# Patient Record
Sex: Female | Born: 1985 | Hispanic: Yes | Marital: Single | Smoking: Former smoker
Health system: Southern US, Community
[De-identification: ages and names within clinical notes are randomized; demographics above are authoritative.]

## PROBLEM LIST (undated history)

## (undated) DIAGNOSIS — F53 Postpartum depression: Secondary | ICD-10-CM

## (undated) DIAGNOSIS — O99345 Other mental disorders complicating the puerperium: Secondary | ICD-10-CM

## (undated) DIAGNOSIS — E785 Hyperlipidemia, unspecified: Secondary | ICD-10-CM

## (undated) DIAGNOSIS — E119 Type 2 diabetes mellitus without complications: Secondary | ICD-10-CM

## (undated) DIAGNOSIS — O24419 Gestational diabetes mellitus in pregnancy, unspecified control: Secondary | ICD-10-CM

## (undated) DIAGNOSIS — R011 Cardiac murmur, unspecified: Secondary | ICD-10-CM

---

## 2011-09-14 DIAGNOSIS — F53 Postpartum depression: Secondary | ICD-10-CM

## 2011-09-14 DIAGNOSIS — O24419 Gestational diabetes mellitus in pregnancy, unspecified control: Secondary | ICD-10-CM

## 2011-09-14 HISTORY — DX: Gestational diabetes mellitus in pregnancy, unspecified control: O24.419

## 2011-09-14 HISTORY — DX: Postpartum depression: F53.0

## 2012-07-06 DIAGNOSIS — O24419 Gestational diabetes mellitus in pregnancy, unspecified control: Secondary | ICD-10-CM

## 2013-11-11 HISTORY — PX: LAPAROSCOPIC CHOLECYSTECTOMY: SUR755

## 2014-08-15 ENCOUNTER — Ambulatory Visit (INDEPENDENT_AMBULATORY_CARE_PROVIDER_SITE_OTHER): Payer: Self-pay | Admitting: Family Medicine

## 2014-08-15 VITALS — BP 120/72 | HR 88 | Temp 98.6°F | Resp 16 | Ht 68.5 in | Wt 202.4 lb

## 2014-08-15 DIAGNOSIS — A09 Infectious gastroenteritis and colitis, unspecified: Secondary | ICD-10-CM

## 2014-08-15 DIAGNOSIS — K529 Noninfective gastroenteritis and colitis, unspecified: Secondary | ICD-10-CM

## 2014-08-15 MED ORDER — CIPROFLOXACIN HCL 250 MG PO TABS
500.0000 mg | ORAL_TABLET | Freq: Once | ORAL | Status: AC
  Administered 2014-08-15: 500 mg via ORAL

## 2014-08-15 MED ORDER — CIPROFLOXACIN HCL 500 MG PO TABS: 500.0000 mg | ORAL_TABLET | Freq: Two times a day (BID) | ORAL | Status: DC

## 2014-08-15 MED ORDER — PROMETHAZINE HCL 25 MG PO TABS: 25.0000 mg | ORAL_TABLET | Freq: Three times a day (TID) | ORAL | Status: DC | PRN

## 2014-08-15 MED ORDER — ONDANSETRON 4 MG PO TBDP
8.0000 mg | ORAL_TABLET | Freq: Once | ORAL | Status: AC
  Administered 2014-08-15: 8 mg via ORAL

## 2014-08-15 NOTE — Patient Instructions (Signed)
Dehydration, Adult °Dehydration is when you lose more fluids from the body than you take in. Vital organs like the kidneys, brain, and heart cannot function without a proper amount of fluids and salt. Any loss of fluids from the body can cause dehydration.  °CAUSES  °· Vomiting. °· Diarrhea. °· Excessive sweating. °· Excessive urine output. °· Fever. °SYMPTOMS  °Mild dehydration °· Thirst. °· Dry lips. °· Slightly dry mouth. °Moderate dehydration °· Very dry mouth. °· Sunken eyes. °· Skin does not bounce back quickly when lightly pinched and released. °· Dark urine and decreased urine production. °· Decreased tear production. °· Headache. °Severe dehydration °· Very dry mouth. °· Extreme thirst. °· Rapid, weak pulse (more than 100 beats per minute at rest). °· Cold hands and feet. °· Not able to sweat in spite of heat and temperature. °· Rapid breathing. °· Blue lips. °· Confusion and lethargy. °· Difficulty being awakened. °· Minimal urine production. °· No tears. °DIAGNOSIS  °Your caregiver will diagnose dehydration based on your symptoms and your exam. Blood and urine tests will help confirm the diagnosis. The diagnostic evaluation should also identify the cause of dehydration. °TREATMENT  °Treatment of mild or moderate dehydration can often be done at home by increasing the amount of fluids that you drink. It is best to drink small amounts of fluid more often. Drinking too much at one time can make vomiting worse. Refer to the home care instructions below. °Severe dehydration needs to be treated at the hospital where you will probably be given intravenous (IV) fluids that contain water and electrolytes. °HOME CARE INSTRUCTIONS  °· Ask your caregiver about specific rehydration instructions. °· Drink enough fluids to keep your urine clear or pale yellow. °· Drink small amounts frequently if you have nausea and vomiting. °· Eat as you normally do. °· Avoid: °¨ Foods or drinks high in sugar. °¨ Carbonated  drinks. °¨ Juice. °¨ Extremely hot or cold fluids. °¨ Drinks with caffeine. °¨ Fatty, greasy foods. °¨ Alcohol. °¨ Tobacco. °¨ Overeating. °¨ Gelatin desserts. °· Wash your hands well to avoid spreading bacteria and viruses. °· Only take over-the-counter or prescription medicines for pain, discomfort, or fever as directed by your caregiver. °· Ask your caregiver if you should continue all prescribed and over-the-counter medicines. °· Keep all follow-up appointments with your caregiver. °SEEK MEDICAL CARE IF: °· You have abdominal pain and it increases or stays in one area (localizes). °· You have a rash, stiff neck, or severe headache. °· You are irritable, sleepy, or difficult to awaken. °· You are weak, dizzy, or extremely thirsty. °SEEK IMMEDIATE MEDICAL CARE IF:  °· You are unable to keep fluids down or you get worse despite treatment. °· You have frequent episodes of vomiting or diarrhea. °· You have blood or green matter (bile) in your vomit. °· You have blood in your stool or your stool looks black and tarry. °· You have not urinated in 6 to 8 hours, or you have only urinated a small amount of very dark urine. °· You have a fever. °· You faint. °MAKE SURE YOU:  °· Understand these instructions. °· Will watch your condition. °· Will get help right away if you are not doing well or get worse. °Document Released: 08/30/2005 Document Revised: 11/22/2011 Document Reviewed: 04/19/2011 °ExitCare® Patient Information ©2015 [employer]. This information is not intended to replace advice given to you by your health care provider. Make sure you discuss any questions you have with your health care   provider. ° °Food Choices to Help Relieve Diarrhea °When you have diarrhea, the foods you eat and your eating habits are very important. Choosing the right foods and drinks can help relieve diarrhea. Also, because diarrhea can last up to 7 days, you need to replace lost fluids and electrolytes (such as sodium, potassium, and  chloride) in order to help prevent dehydration.  °WHAT GENERAL GUIDELINES DO I NEED TO FOLLOW? °· Slowly drink 1 cup (8 oz) of fluid for each episode of diarrhea. If you are getting enough fluid, your urine will be clear or pale yellow. °· Eat starchy foods. Some good choices include white rice, white toast, pasta, low-fiber cereal, baked potatoes (without the skin), saltine crackers, and bagels. °· Avoid large servings of any cooked vegetables. °· Limit fruit to two servings per day. A serving is ½ cup or 1 small piece. °· Choose foods with less than 2 g of fiber per serving. °· Limit fats to less than 8 tsp (38 g) per day. °· Avoid fried foods. °· Eat foods that have probiotics in them. Probiotics can be found in certain dairy products. °· Avoid foods and beverages that may increase the speed at which food moves through the stomach and intestines (gastrointestinal tract). Things to avoid include: °· High-fiber foods, such as dried fruit, raw fruits and vegetables, nuts, seeds, and whole grain foods. °· Spicy foods and high-fat foods. °· Foods and beverages sweetened with high-fructose corn syrup, honey, or sugar alcohols such as xylitol, sorbitol, and mannitol. °WHAT FOODS ARE RECOMMENDED? °Grains °White rice. White, French, or pita breads (fresh or toasted), including plain rolls, buns, or bagels. White pasta. Saltine, soda, or graham crackers. Pretzels. Low-fiber cereal. Cooked cereals made with water (such as cornmeal, farina, or cream cereals). Plain muffins. Matzo. Melba toast. Zwieback.  °Vegetables °Potatoes (without the skin). Strained tomato and vegetable juices. Most well-cooked and canned vegetables without seeds. Tender lettuce. °Fruits °Cooked or canned applesauce, apricots, cherries, fruit cocktail, grapefruit, peaches, pears, or plums. Fresh bananas, apples without skin, cherries, grapes, cantaloupe, grapefruit, peaches, oranges, or plums.  °Meat and Other Protein Products °Baked or boiled chicken.  Eggs. Tofu. Fish. Seafood. Smooth peanut butter. Ground or well-cooked tender beef, ham, veal, lamb, pork, or poultry.  °Dairy °Plain yogurt, kefir, and unsweetened liquid yogurt. Lactose-free milk, buttermilk, or soy milk. Plain hard cheese. °Beverages °Sport drinks. Clear broths. Diluted fruit juices (except prune). Regular, caffeine-free sodas such as ginger ale. Water. Decaffeinated teas. Oral rehydration solutions. Sugar-free beverages not sweetened with sugar alcohols. °Other °Bouillon, broth, or soups made from recommended foods.  °The items listed above may not be a complete list of recommended foods or beverages. Contact your dietitian for more options. °WHAT FOODS ARE NOT RECOMMENDED? °Grains °Whole grain, whole wheat, bran, or rye breads, rolls, pastas, crackers, and cereals. Wild or brown rice. Cereals that contain more than 2 g of fiber per serving. Corn tortillas or taco shells. Cooked or dry oatmeal. Granola. Popcorn. °Vegetables °Raw vegetables. Cabbage, broccoli, Brussels sprouts, artichokes, baked beans, beet greens, corn, kale, legumes, peas, sweet potatoes, and yams. Potato skins. Cooked spinach and cabbage. °Fruits °Dried fruit, including raisins and dates. Raw fruits. Stewed or dried prunes. Fresh apples with skin, apricots, mangoes, pears, raspberries, and strawberries.  °Meat and Other Protein Products °Chunky peanut butter. Nuts and seeds. Beans and lentils. Bacon.  °Dairy °High-fat cheeses. Milk, chocolate milk, and beverages made with milk, such as milk shakes. Cream. Ice cream. °Sweets and Desserts °Sweet rolls, doughnuts, and   sweet breads. Pancakes and waffles. °Fats and Oils °Butter. Cream sauces. Margarine. Salad oils. Plain salad dressings. Olives. Avocados.  °Beverages °Caffeinated beverages (such as coffee, tea, soda, or energy drinks). Alcoholic beverages. Fruit juices with pulp. Prune juice. Soft drinks sweetened with high-fructose corn syrup or sugar alcohols. °Other °Coconut.  Hot sauce. Chili powder. Mayonnaise. Gravy. Cream-based or milk-based soups.  °The items listed above may not be a complete list of foods and beverages to avoid. Contact your dietitian for more information. °WHAT SHOULD I DO IF I BECOME DEHYDRATED? °Diarrhea can sometimes lead to dehydration. Signs of dehydration include dark urine and dry mouth and skin. If you think you are dehydrated, you should rehydrate with an oral rehydration solution. These solutions can be purchased at pharmacies, retail stores, or online.  °Drink ½-1 cup (120-240 mL) of oral rehydration solution each time you have an episode of diarrhea. If drinking this amount makes your diarrhea worse, try drinking smaller amounts more often. For example, drink 1-3 tsp (5-15 mL) every 5-10 minutes.  °A general rule for staying hydrated is to drink 1½-2 L of fluid per day. Talk to your health care provider about the specific amount you should be drinking each day. Drink enough fluids to keep your urine clear or pale yellow. °Document Released: 11/20/2003 Document Revised: 09/04/2013 Document Reviewed: 07/23/2013 °ExitCare® Patient Information ©2015 [employer]. This information is not intended to replace advice given to you by your health care provider. Make sure you discuss any questions you have with your health care provider. ° °Viral Gastroenteritis °Viral gastroenteritis is also known as stomach flu. This condition affects the stomach and intestinal tract. It can cause sudden diarrhea and vomiting. The illness typically lasts 3 to 8 days. Most people develop an immune response that eventually gets rid of the virus. While this natural response develops, the virus can make you quite ill. °CAUSES  °Many different viruses can cause gastroenteritis, such as rotavirus or noroviruses. You can catch one of these viruses by consuming contaminated food or water. You may also catch a virus by sharing utensils or other personal items with an infected person or  by touching a contaminated surface. °SYMPTOMS  °The most common symptoms are diarrhea and vomiting. These problems can cause a severe loss of body fluids (dehydration) and a body salt (electrolyte) imbalance. Other symptoms may include: °· Fever. °· Headache. °· Fatigue. °· Abdominal pain. °DIAGNOSIS  °Your caregiver can usually diagnose viral gastroenteritis based on your symptoms and a physical exam. A stool sample may also be taken to test for the presence of viruses or other infections. °TREATMENT  °This illness typically goes away on its own. Treatments are aimed at rehydration. The most serious cases of viral gastroenteritis involve vomiting so severely that you are not able to keep fluids down. In these cases, fluids must be given through an intravenous line (IV). °HOME CARE INSTRUCTIONS  °· Drink enough fluids to keep your urine clear or pale yellow. Drink small amounts of fluids frequently and increase the amounts as tolerated. °· Ask your caregiver for specific rehydration instructions. °· Avoid: °¨ Foods high in sugar. °¨ Alcohol. °¨ Carbonated drinks. °¨ Tobacco. °¨ Juice. °¨ Caffeine drinks. °¨ Extremely hot or cold fluids. °¨ Fatty, greasy foods. °¨ Too much intake of anything at one time. °¨ Dairy products until 24 to 48 hours after diarrhea stops. °· You may consume probiotics. Probiotics are active cultures of beneficial bacteria. They may lessen the amount and number of diarrheal   stools in adults. Probiotics can be found in yogurt with active cultures and in supplements. °· Wash your hands well to avoid spreading the virus. °· Only take over-the-counter or prescription medicines for pain, discomfort, or fever as directed by your caregiver. Do not give aspirin to children. Antidiarrheal medicines are not recommended. °· Ask your caregiver if you should continue to take your regular prescribed and over-the-counter medicines. °· Keep all follow-up appointments as directed by your caregiver. °SEEK  IMMEDIATE MEDICAL CARE IF:  °· You are unable to keep fluids down. °· You do not urinate at least once every 6 to 8 hours. °· You develop shortness of breath. °· You notice blood in your stool or vomit. This may look like coffee grounds. °· You have abdominal pain that increases or is concentrated in one small area (localized). °· You have persistent vomiting or diarrhea. °· You have a fever. °· The patient is a child younger than 3 months, and he or she has a fever. °· The patient is a child older than 3 months, and he or she has a fever and persistent symptoms. °· The patient is a child older than 3 months, and he or she has a fever and symptoms suddenly get worse. °· The patient is a baby, and he or she has no tears when crying. °MAKE SURE YOU:  °· Understand these instructions. °· Will watch your condition. °· Will get help right away if you are not doing well or get worse. °Document Released: 08/30/2005 Document Revised: 11/22/2011 Document Reviewed: 06/16/2011 °ExitCare® Patient Information ©2015 [employer]. This information is not intended to replace advice given to you by your health care provider. Make sure you discuss any questions you have with your health care provider. ° °

## 2014-09-22 NOTE — Progress Notes (Signed)
Subjective:    Patient ID: Alison Rowe, female    DOB: 19-Aug-1986, 29 y.o.   MRN: [id number] This chart was scribed for Norberto SorensonEva Shaw, MD by Jolene Provostobert Halas, Medical Scribe. This patient was seen in Room 4 and the patient's care was started a 3:32 PM.  Chief Complaint  Patient presents with  . Diarrhea    6 days  . Emesis  . Abdominal Pain    upper adominal pain with some cramping  . Fatigue    Diarrhea  Associated symptoms include abdominal pain, chills, headaches (resolving) and vomiting. Pertinent negatives include no fever.  Emesis  Associated symptoms include abdominal pain, chills, diarrhea, dizziness and headaches (resolving). Pertinent negatives include no fever.  Abdominal Pain Associated symptoms include diarrhea, headaches (resolving), nausea and vomiting. Pertinent negatives include no fever.   Past Medical History  Diagnosis Date  . Depression    Allergies not on file No current outpatient prescriptions on file prior to visit.   No current facility-administered medications on file prior to visit.    HPI Comments: Alison Rowe is a 29 y.o. female who [facility] complaining of nausea and vomiting that started six days ago. Pt states that she was driving [place] the country and stopped at a road side food establishment and that night at the hotel began experiencing significant symptoms of nausea, vomiting, diarrhea and HA. Pt endorses associated chills, weakness, a large volume of diarrhea, and epigastric abdominal pain. Pt denies CP, palpitations, heart racing, fever, hematemesis, hematochezia, or coffee grounds in vomit or diarrhea. Pt states she is able to drink water and gatorade regularly. Pt states she is urinating regularly without pain. Pt states her urine has been a normal color today. Pt is unable to eat anything without an emetic response. Pt took imodium for the first two days without relief of her diarrhea symptoms. Pt has not taken any abx recently.      Review of Systems  Constitutional: Positive for chills. Negative for fever.  Gastrointestinal: Positive for nausea, vomiting, abdominal pain and diarrhea. Negative for blood in stool and abdominal distention.  Genitourinary: Negative for vaginal bleeding.       Irregular period.  Neurological: Positive for dizziness, weakness, light-headedness and headaches (resolving).       Objective:  BP 120/72 mmHg  Pulse 88  Temp(Src) 98.6 F (37 C) (Oral)  Resp 16  Ht 5' 8.5" (1.74 m)  Wt 202 lb 6.4 oz (91.808 kg)  BMI 30.32 kg/m2  SpO2 98%  LMP 08/01/2014 (Approximate)  Physical Exam  Constitutional: She is oriented to person, place, and time. She appears well-developed and well-nourished.  HENT:  Head: Normocephalic and atraumatic.  Eyes: Pupils are equal, round, and reactive to light.  Neck: Neck supple.  Cardiovascular: Normal rate, regular rhythm and normal heart sounds.   No murmur heard. Pulmonary/Chest: Effort normal and breath sounds normal. No respiratory distress. She has no wheezes.  Abdominal: Soft. Bowel sounds are decreased. There is tenderness. There is no rebound, no guarding and no CVA tenderness.  Generalized tenderness. Negaitve murphy's sign. No significant tenderness at mcburnys point.   Neurological: She is alert and oriented to person, place, and time.  Skin: Skin is warm and dry.  Psychiatric: She has a normal mood and affect. Her behavior is normal.  Nursing note and vitals reviewed.      Assessment & Plan:   Gastroenteritis, infectious, presumed - Plan: ondansetron (ZOFRAN-ODT) disintegrating tablet 8 mg, ciprofloxacin (CIPRO) tablet 500 mg  PUsh fluids  Meds ordered this encounter  Medications  . promethazine (PHENERGAN) 25 MG tablet    Sig: Take 1 tablet (25 mg total) by mouth every 8 (eight) hours as needed for nausea or vomiting.    Dispense:  30 tablet    Refill:  0  . ciprofloxacin (CIPRO) 500 MG tablet    Sig: Take 1 tablet (500 mg  total) by mouth 2 (two) times daily.    Dispense:  14 tablet    Refill:  0  . ondansetron (ZOFRAN-ODT) disintegrating tablet 8 mg    Sig:   . ciprofloxacin (CIPRO) tablet 500 mg    Sig:     I personally performed the services described in this documentation, which was scribed in my presence. The recorded information has been reviewed and considered, and addended by me as needed.  Norberto Sorenson, MD MPH

## 2015-06-09 ENCOUNTER — Encounter (HOSPITAL_COMMUNITY): Payer: Self-pay | Admitting: *Deleted

## 2015-06-09 ENCOUNTER — Emergency Department (HOSPITAL_COMMUNITY)
Admission: EM | Admit: 2015-06-09 | Discharge: 2015-06-09 | Disposition: A | Discharge status: Home / Self Care | Payer: Medicaid Other | Source: Home / Self Care | Attending: Family Medicine | Admitting: Family Medicine

## 2015-06-09 DIAGNOSIS — N63 Unspecified lump in breast: Secondary | ICD-10-CM | POA: Diagnosis not present

## 2015-06-09 DIAGNOSIS — N6310 Unspecified lump in the right breast, unspecified quadrant: Secondary | ICD-10-CM

## 2015-06-09 LAB — HM MAMMOGRAPHY

## 2015-06-09 NOTE — ED Provider Notes (Addendum)
CSN: [id number]     Arrival date & time 06/09/15  1513 History   First MD Initiated Contact with Patient 06/09/15 1648     Chief Complaint  Patient presents with  . Breast Problem   (Consider location/radiation/quality/duration/timing/severity/associated sxs/prior Treatment) Patient is a 29 y.o. female presenting with female genitourinary complaint. The history is provided by the patient.  Female GU Problem This is a new problem. The current episode started more than 1 week ago. The problem has not changed since onset.Associated symptoms include chest pain. Associated symptoms comments: Had menses last week with resolution of left sided soreness however mass noted on right, no prior h/o breast problems, has an iud for bc, no fever..    Past Medical History  Diagnosis Date  . Depression    Past Surgical History  Procedure Laterality Date  . Cesarean section    . Gallbladder surgery     Family History  Problem Relation Age of Onset  . Diabetes Mother   . Diabetes Father   . Hyperlipidemia Father   . Hypertension Father   . Diabetes Maternal Grandmother    Social History  Substance Use Topics  . Smoking status: Never Smoker   . Smokeless tobacco: None  . Alcohol Use: No   OB History    No data available     Review of Systems  Constitutional: Negative.   HENT: Negative.   Cardiovascular: Positive for chest pain.  All other systems reviewed and are negative.   Allergies  Review of patient's allergies indicates no known allergies.  Home Medications   Prior to Admission medications   Medication Sig Start Date End Date Taking? Authorizing Provider  ciprofloxacin (CIPRO) 500 MG tablet Take 1 tablet (500 mg total) by mouth 2 (two) times daily. 08/15/14   Sherren Mocha, MD  promethazine (PHENERGAN) 25 MG tablet Take 1 tablet (25 mg total) by mouth every 8 (eight) hours as needed for nausea or vomiting. 08/15/14   Sherren Mocha, MD   Meds Ordered and Administered this Visit    Medications - No data to display  BP 115/81 mmHg  Pulse 101  Temp(Src) 99.3 F (37.4 C) (Oral)  Resp 16  SpO2 98%  LMP 06/03/2015 No data found.   Physical Exam  Constitutional: She appears well-developed and well-nourished.  Neck: Normal range of motion. Neck supple.  Cardiovascular: Normal rate, normal heart sounds and intact distal pulses.   Pulmonary/Chest: Effort normal and breath sounds normal. She exhibits tenderness.    Lymphadenopathy:    She has no cervical adenopathy.  Nursing note and vitals reviewed.   ED Course  Procedures (including critical care time)  Labs Review Labs Reviewed - No data to display  Imaging Review No results found.   Visual Acuity Review  Right Eye Distance:   Left Eye Distance:   Bilateral Distance:    Right Eye Near:   Left Eye Near:    Bilateral Near:         MDM   1. Breast mass, right        Linna Hoff, MD 06/09/15 1308  Linna Hoff, MD 06/09/15 2126

## 2015-06-09 NOTE — Discharge Instructions (Signed)
Heat and advil for soreness, get mammogram as possible for further eval of lump.

## 2015-06-09 NOTE — ED Notes (Signed)
Pt    Reports      She noticed  A  Lump  In  Her  r  Breast  X   1  Week  She reports  It is  Tender  To  The  Touch

## 2015-06-11 ENCOUNTER — Emergency Department (HOSPITAL_COMMUNITY): Admission: EM | Admit: 2015-06-11 | Discharge: 2015-06-11 | Discharge status: Absent without leave | Payer: Self-pay

## 2015-06-12 ENCOUNTER — Telehealth: Payer: Self-pay | Admitting: Family Medicine

## 2015-06-12 DIAGNOSIS — N61 Mastitis without abscess: Secondary | ICD-10-CM

## 2015-06-12 MED ORDER — AMOXICILLIN-POT CLAVULANATE 875-125 MG PO TABS: 1.0000 | ORAL_TABLET | Freq: Two times a day (BID) | ORAL | Status: DC

## 2015-06-12 NOTE — Telephone Encounter (Signed)
Dr. Tilda Burrow from [facility] called and spoke with Dr. Milus Glazier regarding mastitis. Per Dr. Milus Glazier send in Augmentin  1 po bid #[phone]. Copy this to Dr. Artis Flock and Dr. Milus Glazier will call this afternoon and speak with Dr. Artis Flock

## 2015-07-02 ENCOUNTER — Emergency Department (HOSPITAL_COMMUNITY)
Admission: EM | Admit: 2015-07-02 | Discharge: 2015-07-02 | Disposition: A | Discharge status: Home / Self Care | Payer: Medicaid Other | Attending: Emergency Medicine | Admitting: Emergency Medicine

## 2015-07-02 ENCOUNTER — Encounter (HOSPITAL_COMMUNITY): Payer: Self-pay | Admitting: *Deleted

## 2015-07-02 DIAGNOSIS — Z8632 Personal history of gestational diabetes: Secondary | ICD-10-CM | POA: Diagnosis not present

## 2015-07-02 DIAGNOSIS — Z792 Long term (current) use of antibiotics: Secondary | ICD-10-CM | POA: Diagnosis not present

## 2015-07-02 DIAGNOSIS — Z8659 Personal history of other mental and behavioral disorders: Secondary | ICD-10-CM | POA: Insufficient documentation

## 2015-07-02 DIAGNOSIS — N63 Unspecified lump in breast: Secondary | ICD-10-CM | POA: Insufficient documentation

## 2015-07-02 DIAGNOSIS — N6310 Unspecified lump in the right breast, unspecified quadrant: Secondary | ICD-10-CM

## 2015-07-02 HISTORY — DX: Gestational diabetes mellitus in pregnancy, unspecified control: O24.419

## 2015-07-02 MED ORDER — SULFAMETHOXAZOLE-TRIMETHOPRIM 800-160 MG PO TABS: 1.0000 | ORAL_TABLET | Freq: Two times a day (BID) | ORAL | Status: AC

## 2015-07-02 MED ORDER — HYDROCODONE-ACETAMINOPHEN 5-325 MG PO TABS: 2.0000 | ORAL_TABLET | ORAL | Status: DC | PRN

## 2015-07-02 MED ORDER — FLUORESCEIN SODIUM 1 MG OP STRP: 1.0000 | ORAL_STRIP | Freq: Once | OPHTHALMIC | Status: DC

## 2015-07-02 NOTE — Discharge Instructions (Signed)

## 2015-07-02 NOTE — ED Notes (Signed)
Pt reports rt medial breast mass and pain - pt was seen previously at an [facility] and dx w/ mastitis and given antibiotics, pt has completed the course of antibiotics and continues to experience pain and swelling. Pt states her PCP advised it may be a cyst - pt concerned bc the mass is now the biggest it ever has been. Pt in no acute distress, not currently breast feeding. Pt denies fever.

## 2015-07-02 NOTE — ED Provider Notes (Signed)
CSN: [id number]     Arrival date & time 07/02/15  2056 History  By signing my name below, I, Lyndel Safe, attest that this documentation has been prepared under the direction and in the presence of Ok Edwards, [place]. Electronically Signed: Lyndel Safe, ED Scribe. 07/02/2015. 10:16 PM.   Chief Complaint  Patient presents with  . Breast Mass   The history is provided by the patient. No language interpreter was used.   HPI Comments: Corey Caulfield is a 29 y.o. female who presents to the Emergency Department complaining of a worsening, hard area of pain and swelling to right, medial breasts that has been present for 4 weeks but recently worsened today with worsening pain and new onset erythema. The pt has been evaluated for the same complaint at an [facility] over 3 weeks ago. An [place] was obtained at [facility] after [facility] visit and she was diagnosed with mastitis and prescribed Augmentin which she has completed without relief of symptoms. Pt was then evaluated by her PCP who believed the area to by cystic but did not prescribe any medications or treatments and referred her to an OB GYN which she has not been able to follow up with. She is not currently breast feeding. No fevers.    Past Medical History  Diagnosis Date  . Depression   . Gestational diabetes    Past Surgical History  Procedure Laterality Date  . Cesarean section    . Gallbladder surgery     Family History  Problem Relation Age of Onset  . Diabetes Mother   . Diabetes Father   . Hyperlipidemia Father   . Hypertension Father   . Diabetes Maternal Grandmother    Social History  Substance Use Topics  . Smoking status: Never Smoker   . Smokeless tobacco: None  . Alcohol Use: 0.0 oz/week    0 Standard drinks or equivalent per week     Comment: occasionally   OB History    No data available     Review of Systems  Constitutional: Negative for fever.  Skin: Positive for color change ( erythema ).  All other  systems reviewed and are negative.  Allergies  Review of patient's allergies indicates no known allergies.  Home Medications   Prior to Admission medications   Medication Sig Start Date End Date Taking? Authorizing Provider  amoxicillin-clavulanate (AUGMENTIN) 875-125 MG tablet Take 1 tablet by mouth 2 (two) times daily. 06/12/15   Elvina Sidle, MD  ciprofloxacin (CIPRO) 500 MG tablet Take 1 tablet (500 mg total) by mouth 2 (two) times daily. 08/15/14   Sherren Mocha, MD  promethazine (PHENERGAN) 25 MG tablet Take 1 tablet (25 mg total) by mouth every 8 (eight) hours as needed for nausea or vomiting. 08/15/14   Sherren Mocha, MD   BP 125/78 mmHg  Pulse 111  Temp(Src) 98.6 F (37 C) (Oral)  Resp 20  Ht  (1.727 m)  Wt 203 lb 5 oz (92.222 kg)  BMI 30.92 kg/m2  SpO2 96%  LMP 05/19/2015 (Within Days) Physical Exam  Constitutional: She is oriented to person, place, and time. She appears well-developed and well-nourished. No distress.  HENT:  Head: Normocephalic.  Eyes: Conjunctivae are normal.  Neck: Normal range of motion. Neck supple.  Cardiovascular: Normal rate.   Pulmonary/Chest: Effort normal. No respiratory distress.  Musculoskeletal: Normal range of motion.  Neurological: She is alert and oriented to person, place, and time. Coordination normal.  Skin: Skin is warm. There  is erythema.  Baseball sized area of swelling and erythema to right breast.   Psychiatric: She has a normal mood and affect. Her behavior is normal.  Nursing note and vitals reviewed.   ED Course  Procedures  DIAGNOSTIC STUDIES: Oxygen Saturation is 96% on RA, adequate by my interpretation.    COORDINATION OF CARE: 10:13 PM Discussed treatment plan with pt at bedside and pt agreed to plan. Will prescribe antibiotic course and refer pt to a surgical service.    MDM   Final diagnoses:  Breast mass, right     Bactrim Hydrocodone Pt advised to schedule the Surgeon   I personally performed the  services in this documentation, which was scribed in my presence.  The recorded information has been reviewed and considered.   Barnet PallKaren SofiaPAC.   Lonia SkinnerLeslie K PonchatoulaSofia, PA-C 07/02/15 2239  Gwyneth SproutWhitney Plunkett, MD 07/05/15 [phone]

## 2015-07-02 NOTE — ED Notes (Signed)
Patient is alert and orientedx4.  Patient was explained discharge instructions and they understood them with no questions.   

## 2015-07-04 ENCOUNTER — Encounter: Payer: Self-pay | Admitting: Family Medicine

## 2015-07-07 LAB — HM MAMMOGRAPHY

## 2015-07-17 ENCOUNTER — Encounter: Payer: Self-pay | Admitting: Family Medicine

## 2015-07-21 ENCOUNTER — Encounter: Payer: Self-pay | Admitting: Family Medicine

## 2015-07-24 ENCOUNTER — Encounter: Payer: Self-pay | Admitting: Family Medicine

## 2015-08-11 ENCOUNTER — Encounter: Payer: Self-pay | Admitting: Family Medicine

## 2015-10-25 ENCOUNTER — Emergency Department (HOSPITAL_COMMUNITY): Payer: Medicaid Other

## 2015-10-25 ENCOUNTER — Encounter (HOSPITAL_COMMUNITY): Payer: Self-pay | Admitting: *Deleted

## 2015-10-25 ENCOUNTER — Inpatient Hospital Stay (HOSPITAL_COMMUNITY)
Admission: EM | Admit: 2015-10-25 | Discharge: 2015-10-27 | DRG: 601 | Disposition: A | Discharge status: Home / Self Care | Payer: Medicaid Other | Attending: Internal Medicine | Admitting: Internal Medicine

## 2015-10-25 DIAGNOSIS — N611 Abscess of the breast and nipple: Secondary | ICD-10-CM | POA: Diagnosis present

## 2015-10-25 DIAGNOSIS — Z8632 Personal history of gestational diabetes: Secondary | ICD-10-CM | POA: Diagnosis not present

## 2015-10-25 DIAGNOSIS — E669 Obesity, unspecified: Secondary | ICD-10-CM | POA: Diagnosis present

## 2015-10-25 DIAGNOSIS — E119 Type 2 diabetes mellitus without complications: Secondary | ICD-10-CM | POA: Diagnosis present

## 2015-10-25 DIAGNOSIS — Z6830 Body mass index (BMI) 30.0-30.9, adult: Secondary | ICD-10-CM

## 2015-10-25 DIAGNOSIS — Z833 Family history of diabetes mellitus: Secondary | ICD-10-CM | POA: Diagnosis not present

## 2015-10-25 DIAGNOSIS — E1165 Type 2 diabetes mellitus with hyperglycemia: Secondary | ICD-10-CM | POA: Diagnosis not present

## 2015-10-25 DIAGNOSIS — Z23 Encounter for immunization: Secondary | ICD-10-CM | POA: Diagnosis not present

## 2015-10-25 LAB — URINE MICROSCOPIC-ADD ON

## 2015-10-25 LAB — CBC WITH DIFFERENTIAL/PLATELET
BASOS ABS: 0 10*3/uL (ref 0.0–0.1)
BASOS PCT: 0 %
EOS ABS: 0 10*3/uL (ref 0.0–0.7)
Eosinophils Relative: 0 %
HEMATOCRIT: 44.3 % (ref 36.0–46.0)
HEMOGLOBIN: 15 g/dL (ref 12.0–15.0)
Lymphocytes Relative: 21 %
Lymphs Abs: 2.2 10*3/uL (ref 0.7–4.0)
MCH: 30.7 pg (ref 26.0–34.0)
MCHC: 33.9 g/dL (ref 30.0–36.0)
MCV: 90.6 fL (ref 78.0–100.0)
Monocytes Absolute: 0.3 10*3/uL (ref 0.1–1.0)
Monocytes Relative: 3 %
NEUTROS ABS: 8.1 10*3/uL — AB (ref 1.7–7.7)
NEUTROS PCT: 76 %
Platelets: 320 10*3/uL (ref 150–400)
RBC: 4.89 MIL/uL (ref 3.87–5.11)
RDW: 12.4 % (ref 11.5–15.5)
WBC: 10.7 10*3/uL — ABNORMAL HIGH (ref 4.0–10.5)

## 2015-10-25 LAB — URINALYSIS, ROUTINE W REFLEX MICROSCOPIC
BILIRUBIN URINE: NEGATIVE
Glucose, UA: >1000 mg/dL — AB
Hgb urine dipstick: NEGATIVE
KETONES UR: 15 mg/dL — AB
LEUKOCYTES UA: NEGATIVE
NITRITE: NEGATIVE
PROTEIN: NEGATIVE mg/dL
Specific Gravity, Urine: 1.033 — ABNORMAL HIGH (ref 1.005–1.030)
pH: 5 (ref 5.0–8.0)

## 2015-10-25 LAB — COMPREHENSIVE METABOLIC PANEL
ALK PHOS: 78 U/L (ref 38–126)
ALT: 25 U/L (ref 14–54)
ANION GAP: 12 (ref 5–15)
AST: 22 U/L (ref 15–41)
Albumin: 3.9 g/dL (ref 3.5–5.0)
BILIRUBIN TOTAL: 0.7 mg/dL (ref 0.3–1.2)
BUN: 5 mg/dL — ABNORMAL LOW (ref 6–20)
CALCIUM: 9.3 mg/dL (ref 8.9–10.3)
CO2: 25 mmol/L (ref 22–32)
CREATININE: 0.57 mg/dL (ref 0.44–1.00)
Chloride: 96 mmol/L — ABNORMAL LOW (ref 101–111)
Glucose, Bld: 250 mg/dL — ABNORMAL HIGH (ref 65–99)
Potassium: 4 mmol/L (ref 3.5–5.1)
SODIUM: 133 mmol/L — AB (ref 135–145)
Total Protein: 8.2 g/dL — ABNORMAL HIGH (ref 6.5–8.1)

## 2015-10-25 LAB — I-STAT CG4 LACTIC ACID, ED
LACTIC ACID, VENOUS: 1.19 mmol/L (ref 0.5–2.0)
Lactic Acid, Venous: 1.18 mmol/L (ref 0.5–2.0)

## 2015-10-25 LAB — POC URINE PREG, ED: PREG TEST UR: NEGATIVE

## 2015-10-25 MED ORDER — ONDANSETRON HCL 4 MG/2ML IJ SOLN: 4.0000 mg | Freq: Four times a day (QID) | INTRAMUSCULAR | Status: DC | PRN

## 2015-10-25 MED ORDER — SODIUM CHLORIDE 0.9 % IV SOLN
1000.0000 mL | INTRAVENOUS | Status: DC
  Administered 2015-10-25 – 2015-10-27 (×4): 1000 mL via INTRAVENOUS

## 2015-10-25 MED ORDER — HEPARIN SODIUM (PORCINE) 5000 UNIT/ML IJ SOLN
5000.0000 [IU] | Freq: Three times a day (TID) | INTRAMUSCULAR | Status: DC
  Administered 2015-10-26 – 2015-10-27 (×4): 5000 [IU] via SUBCUTANEOUS
  Filled 2015-10-25 (×4): qty 1

## 2015-10-25 MED ORDER — KETOROLAC TROMETHAMINE 30 MG/ML IJ SOLN
30.0000 mg | Freq: Four times a day (QID) | INTRAMUSCULAR | Status: DC | PRN
  Administered 2015-10-25 – 2015-10-27 (×4): 30 mg via INTRAVENOUS
  Filled 2015-10-25 (×4): qty 1

## 2015-10-25 MED ORDER — ONDANSETRON HCL 4 MG/2ML IJ SOLN
4.0000 mg | Freq: Once | INTRAMUSCULAR | Status: AC
  Administered 2015-10-25: 4 mg via INTRAVENOUS
  Filled 2015-10-25: qty 2

## 2015-10-25 MED ORDER — HYDROCODONE-ACETAMINOPHEN 5-325 MG PO TABS
1.0000 | ORAL_TABLET | Freq: Four times a day (QID) | ORAL | Status: DC | PRN
  Administered 2015-10-26 – 2015-10-27 (×3): 2 via ORAL
  Administered 2015-10-27: 1 via ORAL
  Filled 2015-10-25 (×4): qty 2

## 2015-10-25 MED ORDER — ONDANSETRON 4 MG PO TBDP: 4.0000 mg | ORAL_TABLET | Freq: Three times a day (TID) | ORAL | Status: DC | PRN

## 2015-10-25 MED ORDER — HYDROMORPHONE HCL 1 MG/ML IJ SOLN
1.0000 mg | Freq: Once | INTRAMUSCULAR | Status: AC
  Administered 2015-10-25: 1 mg via INTRAVENOUS
  Filled 2015-10-25: qty 1

## 2015-10-25 MED ORDER — SODIUM CHLORIDE 0.9 % IV SOLN
1000.0000 mL | Freq: Once | INTRAVENOUS | Status: AC
  Administered 2015-10-25: 1000 mL via INTRAVENOUS

## 2015-10-25 MED ORDER — CLINDAMYCIN PHOSPHATE 900 MG/50ML IV SOLN
900.0000 mg | Freq: Once | INTRAVENOUS | Status: AC
  Administered 2015-10-25: 900 mg via INTRAVENOUS
  Filled 2015-10-25: qty 50

## 2015-10-25 MED ORDER — INSULIN ASPART 100 UNIT/ML ~~LOC~~ SOLN
0.0000 [IU] | SUBCUTANEOUS | Status: DC
  Administered 2015-10-26: 3 [IU] via SUBCUTANEOUS
  Administered 2015-10-26: 2 [IU] via SUBCUTANEOUS
  Administered 2015-10-26: 5 [IU] via SUBCUTANEOUS
  Administered 2015-10-26: 3 [IU] via SUBCUTANEOUS
  Administered 2015-10-26: 2 [IU] via SUBCUTANEOUS
  Administered 2015-10-26: 5 [IU] via SUBCUTANEOUS
  Administered 2015-10-27 (×2): 3 [IU] via SUBCUTANEOUS
  Administered 2015-10-27: 2 [IU] via SUBCUTANEOUS

## 2015-10-25 NOTE — ED Notes (Addendum)
Attempted report 

## 2015-10-25 NOTE — ED Notes (Signed)
Patient called for triage. Unable to locate at this time.

## 2015-10-25 NOTE — ED Provider Notes (Signed)
CSN: [id number]     Arrival date & time 10/25/15  1504 History   First MD Initiated Contact with Patient 10/25/15 1738     Chief Complaint  Patient presents with  . Breast Problem   Patient is a 30 y.o. female presenting with abscess. The history is provided by the patient and the spouse. No language interpreter was used.  Abscess Abscess location: R breast. Abscess quality: fluctuance, painful, redness and warmth   Red streaking: no   Duration:  4 weeks Progression:  Worsening Pain details:    Quality:  Aching   Severity:  Moderate   Duration:  1 week   Timing:  Constant   Progression:  Worsening Chronicity:  Recurrent Context: diabetes   Relieved by:  None tried Worsened by:  Nothing tried Associated symptoms: no fever, no nausea and no vomiting   Risk factors: prior abscess     Past Medical History  Diagnosis Date  . Depression   . Gestational diabetes    Past Surgical History  Procedure Laterality Date  . Cesarean section    . Gallbladder surgery     Family History  Problem Relation Age of Onset  . Diabetes Mother   . Diabetes Father   . Hyperlipidemia Father   . Hypertension Father   . Diabetes Maternal Grandmother    Social History  Substance Use Topics  . Smoking status: Never Smoker   . Smokeless tobacco: None  . Alcohol Use: 0.0 oz/week    0 Standard drinks or equivalent per week     Comment: occasionally   OB History    No data available     Review of Systems  Constitutional: Positive for chills. Negative for fever.  HENT: Negative for congestion and dental problem.   Eyes: Negative for discharge and itching.  Respiratory: Negative for chest tightness and shortness of breath.   Cardiovascular: Negative for chest pain.  Gastrointestinal: Negative for nausea and vomiting.  Genitourinary: Negative for dysuria and difficulty urinating.  Skin: Positive for rash.    Allergies  Review of patient's allergies indicates no known allergies.  Home  Medications   Prior to Admission medications   Medication Sig Start Date End Date Taking? Authorizing Provider  acetaminophen-codeine (TYLENOL #3) 300-30 MG tablet Take by mouth every 4 (four) hours as needed for moderate pain.   Yes Historical Provider, MD  doxycycline (VIBRA-TABS) 100 MG tablet Take 1 tablet by mouth 2 (two) times daily. 10/20/15  Yes Historical Provider, MD  fluconazole (DIFLUCAN) 150 MG tablet Take 1 tablet by mouth as needed. Yeast infection 10/20/15  Yes Historical Provider, MD  HYDROcodone-acetaminophen (NORCO) 10-325 MG tablet Take 1 tablet by mouth every 6 (six) hours as needed.   Yes Historical Provider, MD  ibuprofen (ADVIL,MOTRIN) 600 MG tablet Take by mouth every 6 (six) hours as needed.   Yes Historical Provider, MD  levonorgestrel (MIRENA) 20 MCG/24HR IUD 1 each by Intrauterine route once.   Yes Historical Provider, MD  acetaminophen (TYLENOL) 500 MG tablet Take 500 mg by mouth every 6 (six) hours as needed.    Historical Provider, MD  HYDROcodone-acetaminophen (NORCO/VICODIN) 5-325 MG tablet Take 2 tablets by mouth every 4 (four) hours as needed. 07/02/15   Elson Areas, PA-C  naproxen (NAPROSYN) 500 MG tablet Take 500 mg by mouth daily as needed.    Historical Provider, MD  VITAMIN E PO Take by mouth.    Historical Provider, MD   BP 98/62 mmHg  Pulse 103  Temp(Src) 98.4 F (36.9 C)  Resp 16  Ht  (1.727 m)  Wt 89.869 kg  BMI 30.13 kg/m2  SpO2 96%  LMP 10/25/2015 Physical Exam  Constitutional: She is oriented to person, place, and time. She appears well-developed and well-nourished.  HENT:  Head: Normocephalic and atraumatic.  Eyes: Pupils are equal, round, and reactive to light.  Neck: Normal range of motion.  Cardiovascular: Normal rate and regular rhythm.   Pulmonary/Chest: No respiratory distress.  Abdominal: She exhibits no distension. There is no tenderness. There is no rebound and no guarding.  Musculoskeletal: Normal range of motion.    Neurological: She is alert and oriented to person, place, and time.  Skin: Rash noted.     Psychiatric: She has a normal mood and affect. Her behavior is normal.  Nursing note and vitals reviewed.   ED Course  Procedures (including critical care time) Labs Review Labs Reviewed  COMPREHENSIVE METABOLIC PANEL - Abnormal; Notable for the following:    Sodium 133 (*)    Chloride 96 (*)    Glucose, Bld 250 (*)    BUN 5 (*)    Total Protein 8.2 (*)    All other components within normal limits  URINALYSIS, ROUTINE W REFLEX MICROSCOPIC (NOT AT [facility]) - Abnormal; Notable for the following:    Specific Gravity, Urine 1.033 (*)    Glucose, UA >1000 (*)    Ketones, ur 15 (*)    All other components within normal limits  CBC WITH DIFFERENTIAL/PLATELET - Abnormal; Notable for the following:    WBC 10.7 (*)    Neutro Abs 8.1 (*)    All other components within normal limits  URINE MICROSCOPIC-ADD ON - Abnormal; Notable for the following:    Squamous Epithelial / LPF 0-5 (*)    Bacteria, UA RARE (*)    All other components within normal limits  HEMOGLOBIN A1C  I-STAT CG4 LACTIC ACID, ED  POC URINE PREG, ED  I-STAT CG4 LACTIC ACID, ED    Imaging Review US Breast Ltd Uni Right Inc Axilla  10/25/2015  CLINICAL DATA:  30 year old female left inflammatory changes and swelling of the right breast concerning for abscess. EXAM: ULTRASOUND OF THE right BREAST COMPARISON:  Previous exam(s). FINDINGS: On physical exam, right breast tenderness and inflammation noted Targeted sonographic images of the right breast in the region of the clinical concern in the upper inner quadrant demonstrates lobular appearing complex fluid collection with low-level internal echogenicity which may represent hematoma or abscess. No flow identified within these collections. The largest collection measures approximately 10 x 4 cm and extends from the retroareolar region into the upper inner quadrant. These collection is  approximately 5 mm from the skin surface. IMPRESSION: Multilobulated complex fluid collection in the right breast may represent hematoma or abscesses. Clinical correlation is recommended. Electronically Signed   By: Elgie Collard M.D.   On: 10/25/2015 20:19   I have personally reviewed and evaluated these images and lab results as part of my medical decision-making.   EKG Interpretation None      MDM   Final diagnoses:  Abscess of right breast    Patient with likely new onset type 2 diabetes presents today for worsening of right breast abscess. She has been treated outpatient with doxycycline since last Monday  (6 days ago). She endorses chills and worsening pain with prompt her visit today.  Patient afebrile with heart rate of 109 in triage. She has erythematous, fluctuant, tender right breast abscess. She  appears systemically well. She has a lactate of 1.19., CMP with blood sugar of 250. White blood cell count 10.7. UA negative.  I have concern for right recurrent right breast abscess which has failed outpatient treatment.  I will start clindamycin in the emergency department, 1 L normal saline bolus, Dilaudid, Zofran and obtain a right breast ultrasound to completely characterize abscess versus mass.  8:51 PM: Discussed patient's course and expected need for treatment with internal medicine teaching service who will come see patient in the ED.  Discussed care patient with my attending Dr. Jeraldine Loots.  Dan Humphreys, MD 10/25/15 1610  Gerhard Munch, MD 10/25/15 [phone]

## 2015-10-25 NOTE — H&P (Signed)
Date: 10/25/2015               Patient Name:  Alison Rowe MRN: [id number]  DOB: Sep 04, 1986 Age / Sex: 30 y.o., female   PCP: [facility]         Medical Service: Internal Medicine Teaching Service         Attending Physician: Dr. Gerhard Munch, MD    First Contact: Dr. Gwynn Burly Pager: [phone]  Second Contact: Dr. Heywood Iles Pager: [phone]       After Hours (After 5p/  First Contact Pager: [phone]  weekends / holidays): Second Contact Pager: [phone]   Chief Complaint: Worsening right breast pain  History of Present Illness: Alison Rowe is a 29yo with PMH of gestational DM and previous right breast abscess who presents with worsening right breast pain for the past 4 weeks. She says 4 weeks ago, she started having sharp and burning pain deep to her right nipple as well as underlying 'lumps'. Over the past few weeks, she has noticed that the pain has gotten worse, the lumps have increased in size, and the overlying breast skin has become warm and red. She went to see her new PCP 6 days ago, who started her on doxycycline. However, her symptoms have continued to get worse since then, and she started feeling subjective 'hot flashes' and chills since yesterday which prompted her to come in. She had these identical symptoms in the same area of her breast back in 05/2015 months ago, which she said was relieved with drainage. She is not breast feeding, denies any trauma to the breast. She denies other symptoms, including nipple discharge, headache, myalgias, chest pain, SOB, cough, N/V/D, abdominal pain, or rashes.  She is a G1P1, last pregnancy ~3 1/2 years ago complicated by GDM t/w insulin, resolved before end of pregnancy, birth via C-section. LMP today. Has IUD in place for [facility]. H/o yeast infections, but no h/o STDs. Non-smoker, denies EtOH or rec drug use.    Meds: Current Facility-Administered Medications  Medication Dose Route Frequency Provider Last Rate  Last Dose  . 0.9 %  sodium chloride infusion  1,000 mL Intravenous Continuous Dan Humphreys, MD 125 mL/hr at 10/25/15 1856 1,000 mL at 10/25/15 1856  . ketorolac (TORADOL) 30 MG/ML injection 30 mg  30 mg Intravenous Q6H PRN Lora Paula, MD       Current Outpatient Prescriptions  Medication Sig Dispense Refill  . acetaminophen-codeine (TYLENOL #3) 300-30 MG tablet Take by mouth every 4 (four) hours as needed for moderate pain.    Marland Kitchen doxycycline (VIBRA-TABS) 100 MG tablet Take 1 tablet by mouth 2 (two) times daily.  0  . fluconazole (DIFLUCAN) 150 MG tablet Take 1 tablet by mouth as needed. Yeast infection  0  . HYDROcodone-acetaminophen (NORCO) 10-325 MG tablet Take 1 tablet by mouth every 6 (six) hours as needed.    Marland Kitchen ibuprofen (ADVIL,MOTRIN) 600 MG tablet Take by mouth every 6 (six) hours as needed.    Marland Kitchen levonorgestrel (MIRENA) 20 MCG/24HR IUD 1 each by Intrauterine route once.    Marland Kitchen acetaminophen (TYLENOL) 500 MG tablet Take 500 mg by mouth every 6 (six) hours as needed.    Marland Kitchen HYDROcodone-acetaminophen (NORCO/VICODIN) 5-325 MG tablet Take 2 tablets by mouth every 4 (four) hours as needed. 16 tablet 0  . naproxen (NAPROSYN) 500 MG tablet Take 500 mg by mouth daily as needed.    Marland Kitchen VITAMIN E PO Take by mouth.  Allergies: Allergies as of 10/25/2015  . (No Known Allergies)   Past Medical History  Diagnosis Date  . Depression   . Gestational diabetes    Past Surgical History  Procedure Laterality Date  . Cesarean section    . Gallbladder surgery     Family History  Problem Relation Age of Onset  . Diabetes Mother   . Diabetes Father   . Hyperlipidemia Father   . Hypertension Father   . Diabetes Maternal Grandmother    Social History   Social History  . Marital Status: Single    Spouse Name: N/A  . Number of Children: N/A  . Years of Education: N/A   Occupational History  . Not on file.   Social History Main Topics  . Smoking status: Never Smoker   . Smokeless  tobacco: Not on file  . Alcohol Use: 0.0 oz/week    0 Standard drinks or equivalent per week     Comment: occasionally  . Drug Use: No  . Sexual Activity: Yes    Birth Control/ Protection: IUD   Other Topics Concern  . Not on file   Social History Narrative    Review of Systems: Pertinent items noted in HPI and remainder of comprehensive ROS otherwise negative.  Physical Exam: Blood pressure 100/65, pulse 96, temperature 98.4 F (36.9 C), resp. rate 16, height 5\' 8"  (1.727 m), weight 198 lb 2 oz (89.869 kg), last menstrual period 10/25/2015, SpO2 95 %. Gen: Well-appearing, alert and oriented to person, place, and time HEENT: Oropharynx clear without erythema or exudate.  Neck: No cervical LAD, no thyromegaly or nodules, no JVD noted. CV: Normal rate, regular rhythm, no murmurs, rubs, or gallops Pulmonary: Normal effort, CTA bilaterally, no crackles or wheezes Abdominal: Soft, non-tender, non-distended, without rebound, guarding, or masses Extremities: Distal pulses 2+ in upper and lower extremities bilaterally, no tenderness, erythema or edema Skin: Right breast with golf ball-sized indurated swelling, exquisitely tender to palpation with overlying edema and erythema. No nipple discharge expressible. Left breast normal in appearance.  Lab results: Basic Metabolic Panel:  Recent Labs  16/10/96 1556  NA 133*  K 4.0  CL 96*  CO2 25  GLUCOSE 250*  BUN 5*  CREATININE 0.57  CALCIUM 9.3   Liver Function Tests:  Recent Labs  10/25/15 1556  AST 22  ALT 25  ALKPHOS 78  BILITOT 0.7  PROT 8.2*  ALBUMIN 3.9   CBC:  Recent Labs  10/25/15 1556  WBC 10.7*  NEUTROABS 8.1*  HGB 15.0  HCT 44.3  MCV 90.6  PLT 320   Urinalysis:  Recent Labs  10/25/15 1548  COLORURINE YELLOW  LABSPEC 1.033*  PHURINE 5.0  GLUCOSEU >1000*  HGBUR NEGATIVE  BILIRUBINUR NEGATIVE  KETONESUR 15*  PROTEINUR NEGATIVE  NITRITE NEGATIVE  LEUKOCYTESUR NEGATIVE   Imaging results:    US Breast Ltd Uni Right Inc Axilla  10/25/2015  CLINICAL DATA:  30 year old female left inflammatory changes and swelling of the right breast concerning for abscess. EXAM: ULTRASOUND OF THE right BREAST COMPARISON:  Previous exam(s). FINDINGS: On physical exam, right breast tenderness and inflammation noted Targeted sonographic images of the right breast in the region of the clinical concern in the upper inner quadrant demonstrates lobular appearing complex fluid collection with low-level internal echogenicity which may represent hematoma or abscess. No flow identified within these collections. The largest collection measures approximately 10 x 4 cm and extends from the retroareolar region into the upper inner quadrant. These collection is approximately  5 mm from the skin surface. IMPRESSION: Multilobulated complex fluid collection in the right breast may represent hematoma or abscesses. Clinical correlation is recommended. Electronically Signed   By: Elgie Collard M.D.   On: 10/25/2015 20:19   Assessment & Plan by Problem: 1. Right breast abscess - pt afebrile here, initial mild tachycardia with otherwise normal VS. WBC 10.7. H/o of previous abscess in same location several months ago s/p drainage. Other RF includes likely newly-diagnosed T2DM. Ultrasound here shows multilobulated fluid collection, largest of which is 10x4cm. Did not improve with doxycycline. 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 -S/p clindamycin IV in ED, also s/p 6 days of doxy (last dose 2/11), consider extending PO abx tomorrow - likely worsened despite doxy simply due to lack of source control -Will need drainage in AM; consult accordingly -Toradol q 6hr PRN, norco for breakthrough pain -Zofran PRN for nausea  2. Newly-diagnosed T2DM - does have h/o gestational DM that resolved before end of pregnancy, labs at PCP 6 days ago showed elevated glucose and CBG here 250. Pt is obese, has strong FH of T2DM. LIkely a RF for her recurrent  breast abscess as well.  -A1c -Sensitive SSI -Consider discharging with metformin, f/u with PCP for further management  PPX - DVT - Heparin  Dispo: Disposition is deferred at this time, awaiting improvement of current medical problems. Anticipated discharge in approximately 1 day(s).   The patient does have a current PCP ([facility]) and does need an [facility] hospital follow-up appointment after discharge.  The patient does not have transportation limitations that hinder transportation to clinic appointments.  Signed: Darrick Huntsman, MD 10/25/2015, 9:10 PM

## 2015-10-25 NOTE — ED Notes (Signed)
The pt is c/o a  Lump in her rt breast since august.  She has had needle aslpirations performed and she is due to be seen in 6 months.  Her pain meds are not helping now and she thinks.. She has an appointment with a doctor on Tuesday at mammogram place.  She is not sure if   She has had a temp.  The rt breast is sl red with more swelling around the nipple and above.  lmp  now

## 2015-10-25 NOTE — ED Notes (Signed)
The pt took tylenol with codeine at 1130 am today

## 2015-10-26 ENCOUNTER — Inpatient Hospital Stay (HOSPITAL_COMMUNITY): Payer: Medicaid Other | Admitting: Anesthesiology

## 2015-10-26 ENCOUNTER — Encounter (HOSPITAL_COMMUNITY)
Admission: EM | Disposition: A | Discharge status: Home / Self Care | Payer: Self-pay | Source: Home / Self Care | Attending: Internal Medicine

## 2015-10-26 DIAGNOSIS — N611 Abscess of the breast and nipple: Secondary | ICD-10-CM | POA: Insufficient documentation

## 2015-10-26 DIAGNOSIS — Z833 Family history of diabetes mellitus: Secondary | ICD-10-CM

## 2015-10-26 DIAGNOSIS — Z8632 Personal history of gestational diabetes: Secondary | ICD-10-CM

## 2015-10-26 DIAGNOSIS — E1165 Type 2 diabetes mellitus with hyperglycemia: Secondary | ICD-10-CM

## 2015-10-26 HISTORY — PX: EVACUATION BREAST HEMATOMA: SHX1537

## 2015-10-26 LAB — BASIC METABOLIC PANEL
Anion gap: 10 (ref 5–15)
BUN: 9 mg/dL (ref 6–20)
CHLORIDE: 104 mmol/L (ref 101–111)
CO2: 25 mmol/L (ref 22–32)
CREATININE: 0.54 mg/dL (ref 0.44–1.00)
Calcium: 9.1 mg/dL (ref 8.9–10.3)
GFR calc Af Amer: >60 mL/min (ref >60)
GLUCOSE: 243 mg/dL — AB (ref 65–99)
POTASSIUM: 3.7 mmol/L (ref 3.5–5.1)
SODIUM: 139 mmol/L (ref 135–145)

## 2015-10-26 LAB — CBC
HEMATOCRIT: 40.4 % (ref 36.0–46.0)
Hemoglobin: 13.4 g/dL (ref 12.0–15.0)
MCH: 30.5 pg (ref 26.0–34.0)
MCHC: 33.2 g/dL (ref 30.0–36.0)
MCV: 91.8 fL (ref 78.0–100.0)
Platelets: 306 10*3/uL (ref 150–400)
RBC: 4.4 MIL/uL (ref 3.87–5.11)
RDW: 12.5 % (ref 11.5–15.5)
WBC: 11 10*3/uL — AB (ref 4.0–10.5)

## 2015-10-26 LAB — GLUCOSE, CAPILLARY
GLUCOSE-CAPILLARY: 176 mg/dL — AB (ref 65–99)
GLUCOSE-CAPILLARY: 180 mg/dL — AB (ref 65–99)
GLUCOSE-CAPILLARY: 208 mg/dL — AB (ref 65–99)
Glucose-Capillary: 169 mg/dL — ABNORMAL HIGH (ref 65–99)
Glucose-Capillary: 179 mg/dL — ABNORMAL HIGH (ref 65–99)
Glucose-Capillary: 202 mg/dL — ABNORMAL HIGH (ref 65–99)
Glucose-Capillary: 256 mg/dL — ABNORMAL HIGH (ref 65–99)
Glucose-Capillary: 298 mg/dL — ABNORMAL HIGH (ref 65–99)

## 2015-10-26 LAB — SURGICAL PCR SCREEN
MRSA, PCR: NEGATIVE
Staphylococcus aureus: NEGATIVE

## 2015-10-26 SURGERY — EVACUATION, HEMATOMA, BREAST
Anesthesia: General | Site: Breast | Laterality: Right

## 2015-10-26 MED ORDER — FENTANYL CITRATE (PF) 250 MCG/5ML IJ SOLN
INTRAMUSCULAR | Status: DC | PRN
  Administered 2015-10-26: 100 ug via INTRAVENOUS

## 2015-10-26 MED ORDER — BUPIVACAINE HCL (PF) 0.25 % IJ SOLN
INTRAMUSCULAR | Status: DC | PRN
  Administered 2015-10-26: 3 mL

## 2015-10-26 MED ORDER — LIDOCAINE-EPINEPHRINE (PF) 1 %-1:200000 IJ SOLN
INTRAMUSCULAR | Status: AC
  Filled 2015-10-26: qty 30

## 2015-10-26 MED ORDER — SCOPOLAMINE 1 MG/3DAYS TD PT72
MEDICATED_PATCH | TRANSDERMAL | Status: DC | PRN
  Administered 2015-10-26: 1 via TRANSDERMAL

## 2015-10-26 MED ORDER — FENTANYL CITRATE (PF) 250 MCG/5ML IJ SOLN
INTRAMUSCULAR | Status: AC
  Filled 2015-10-26: qty 5

## 2015-10-26 MED ORDER — MIDAZOLAM HCL 2 MG/2ML IJ SOLN
INTRAMUSCULAR | Status: AC
  Filled 2015-10-26: qty 2

## 2015-10-26 MED ORDER — PROPOFOL 10 MG/ML IV BOLUS
INTRAVENOUS | Status: DC | PRN
  Administered 2015-10-26: 160 mg via INTRAVENOUS

## 2015-10-26 MED ORDER — ONDANSETRON HCL 4 MG/2ML IJ SOLN
INTRAMUSCULAR | Status: AC
  Filled 2015-10-26: qty 2

## 2015-10-26 MED ORDER — BUPIVACAINE HCL (PF) 0.25 % IJ SOLN
INTRAMUSCULAR | Status: AC
  Filled 2015-10-26: qty 30

## 2015-10-26 MED ORDER — LIDOCAINE HCL (CARDIAC) 20 MG/ML IV SOLN
INTRAVENOUS | Status: DC | PRN
  Administered 2015-10-26: 80 mg via INTRATRACHEAL

## 2015-10-26 MED ORDER — PROPOFOL 10 MG/ML IV BOLUS
INTRAVENOUS | Status: AC
  Filled 2015-10-26: qty 20

## 2015-10-26 MED ORDER — LIDOCAINE-EPINEPHRINE 1 %-1:100000 IJ SOLN
INTRAMUSCULAR | Status: AC
  Filled 2015-10-26: qty 1

## 2015-10-26 MED ORDER — INFLUENZA VAC SPLIT QUAD 0.5 ML IM SUSY
0.5000 mL | PREFILLED_SYRINGE | INTRAMUSCULAR | Status: AC
  Administered 2015-10-27: 0.5 mL via INTRAMUSCULAR
  Filled 2015-10-26: qty 0.5

## 2015-10-26 MED ORDER — PNEUMOCOCCAL VAC POLYVALENT 25 MCG/0.5ML IJ INJ
0.5000 mL | INJECTION | INTRAMUSCULAR | Status: AC
  Administered 2015-10-27: 0.5 mL via INTRAMUSCULAR
  Filled 2015-10-26: qty 0.5

## 2015-10-26 MED ORDER — SODIUM CHLORIDE 0.9 % IV SOLN
INTRAVENOUS | Status: DC | PRN
  Administered 2015-10-26 (×2): via INTRAVENOUS

## 2015-10-26 MED ORDER — PIPERACILLIN-TAZOBACTAM 3.375 G IVPB
3.3750 g | Freq: Three times a day (TID) | INTRAVENOUS | Status: DC
  Administered 2015-10-26 – 2015-10-27 (×3): 3.375 g via INTRAVENOUS
  Filled 2015-10-26 (×5): qty 50

## 2015-10-26 MED ORDER — MIDAZOLAM HCL 2 MG/2ML IJ SOLN
INTRAMUSCULAR | Status: DC | PRN
  Administered 2015-10-26: 2 mg via INTRAVENOUS

## 2015-10-26 MED ORDER — LIDOCAINE HCL (CARDIAC) 20 MG/ML IV SOLN
INTRAVENOUS | Status: AC
  Filled 2015-10-26: qty 5

## 2015-10-26 MED ORDER — ONDANSETRON HCL 4 MG/2ML IJ SOLN
INTRAMUSCULAR | Status: DC | PRN
  Administered 2015-10-26: 4 mg via INTRAVENOUS

## 2015-10-26 MED ORDER — SCOPOLAMINE 1 MG/3DAYS TD PT72
MEDICATED_PATCH | TRANSDERMAL | Status: AC
  Filled 2015-10-26: qty 1

## 2015-10-26 MED ORDER — 0.9 % SODIUM CHLORIDE (POUR BTL) OPTIME
TOPICAL | Status: DC | PRN
  Administered 2015-10-26: 1000 mL

## 2015-10-26 SURGICAL SUPPLY — 41 items
BINDER BREAST LRG (GAUZE/BANDAGES/DRESSINGS) ×3 IMPLANT
BINDER BREAST MEDIUM (GAUZE/BANDAGES/DRESSINGS) ×3 IMPLANT
BINDER BREAST XLRG (GAUZE/BANDAGES/DRESSINGS) IMPLANT
CHLORAPREP W/TINT 26ML (MISCELLANEOUS) ×3 IMPLANT
COVER SURGICAL LIGHT HANDLE (MISCELLANEOUS) ×3 IMPLANT
DECANTER SPIKE VIAL GLASS SM (MISCELLANEOUS) ×6 IMPLANT
DRAPE LAPAROTOMY T 98X78 PEDS (DRAPES) ×3 IMPLANT
DRAPE UTILITY XL STRL (DRAPES) ×6 IMPLANT
DRSG PAD ABDOMINAL 8X10 ST (GAUZE/BANDAGES/DRESSINGS) ×3 IMPLANT
ELECT CAUTERY BLADE 6.4 (BLADE) ×3 IMPLANT
ELECT REM PT RETURN 9FT ADLT (ELECTROSURGICAL) ×6
ELECTRODE REM PT RTRN 9FT ADLT (ELECTROSURGICAL) ×2 IMPLANT
GAUZE PACKING IODOFORM 1/4X15 (GAUZE/BANDAGES/DRESSINGS) ×3 IMPLANT
GAUZE SPONGE 4X4 12PLY STRL (GAUZE/BANDAGES/DRESSINGS) ×3 IMPLANT
GLOVE BIO SURGEON STRL SZ7.5 (GLOVE) ×3 IMPLANT
GLOVE BIOGEL PI IND STRL 8 (GLOVE) ×1 IMPLANT
GLOVE BIOGEL PI INDICATOR 8 (GLOVE) ×2
GOWN STRL REUS W/ TWL LRG LVL3 (GOWN DISPOSABLE) ×2 IMPLANT
GOWN STRL REUS W/ TWL XL LVL3 (GOWN DISPOSABLE) ×1 IMPLANT
GOWN STRL REUS W/TWL LRG LVL3 (GOWN DISPOSABLE) ×4
GOWN STRL REUS W/TWL XL LVL3 (GOWN DISPOSABLE) ×2
KIT BASIN OR (CUSTOM PROCEDURE TRAY) ×3 IMPLANT
KIT ROOM TURNOVER OR (KITS) ×3 IMPLANT
LIQUID BAND (GAUZE/BANDAGES/DRESSINGS) ×3 IMPLANT
NEEDLE HYPO 25GX1X1/2 BEV (NEEDLE) ×6 IMPLANT
NS IRRIG 1000ML POUR BTL (IV SOLUTION) ×3 IMPLANT
PACK SURGICAL SETUP 50X90 (CUSTOM PROCEDURE TRAY) ×3 IMPLANT
PAD ARMBOARD 7.5X6 YLW CONV (MISCELLANEOUS) ×3 IMPLANT
PENCIL BUTTON HOLSTER BLD 10FT (ELECTRODE) ×3 IMPLANT
SPONGE LAP 4X18 X RAY DECT (DISPOSABLE) ×3 IMPLANT
SUT MNCRL AB 4-0 PS2 18 (SUTURE) ×3 IMPLANT
SUT SILK 3 0 SH 30 (SUTURE) IMPLANT
SUT VIC AB 3-0 SH 27 (SUTURE) ×2
SUT VIC AB 3-0 SH 27XBRD (SUTURE) ×1 IMPLANT
SYR BULB 3OZ (MISCELLANEOUS) ×3 IMPLANT
SYR CONTROL 10ML LL (SYRINGE) ×6 IMPLANT
TOWEL OR 17X24 6PK STRL BLUE (TOWEL DISPOSABLE) ×3 IMPLANT
TOWEL OR 17X26 10 PK STRL BLUE (TOWEL DISPOSABLE) ×3 IMPLANT
TUBE CONNECTING 12'X1/4 (SUCTIONS)
TUBE CONNECTING 12X1/4 (SUCTIONS) IMPLANT
YANKAUER SUCT BULB TIP NO VENT (SUCTIONS) ×3 IMPLANT

## 2015-10-26 NOTE — Progress Notes (Signed)
Subjective: [name] was seen and examined this morning.  She reports no acute events overnight since admission.  She reports her pain is adequately controlled at present but states her right breast is uncomfortable due to abscess.  No fevers or chills, no nausea or vomiting, no nipple drainage.  Objective: Vital signs in last 24 hours: Filed Vitals:   10/25/15 2230 10/25/15 2315 10/25/15 2349 10/26/15 0553  BP: 108/66 98/62 93/60  99/66  Pulse: 94 103 98 93  Temp:   98.1 F (36.7 C) 98 F (36.7 C)  TempSrc:    Oral  Resp: Height:      Weight:      SpO2: 96% 96% 95% 96%   Weight change:   Intake/Output Summary (Last 24 hours) at 10/26/15 1610 Last data filed at 10/26/15 9604  Gross per 24 hour  Intake      0 ml  Output    450 ml  Net   -450 ml   General: resting in bed, no distress HEENT: EOMI, no scleral icterus Cardiac: RRR, no rubs, murmurs or gallops Pulm: normal work of breathing Abd: soft, nontender, nondistended Skin: RIGHT breast with indurated swelling, not very tender this morning with overlying edema.  No drainage or discharge from nipple, skin intact. Ext: warm and well perfused, no pedal edema Neuro: alert and oriented X3, cranial nerves II-XII grossly intact  Lab Results: Basic Metabolic Panel:  Recent Labs Lab 10/25/15 1556 10/26/15 0541  NA 133* 139  K 4.0 3.7  CL 96* 104  CO2 25 25  GLUCOSE 250* 243*  BUN 5* 9  CREATININE 0.57 0.54  CALCIUM 9.3 9.1   Liver Function Tests:  Recent Labs Lab 10/25/15 1556  AST 22  ALT 25  ALKPHOS 78  BILITOT 0.7  PROT 8.2*  ALBUMIN 3.9   No results for input(s): LIPASE, AMYLASE in the last 168 hours. No results for input(s): AMMONIA in the last 168 hours. CBC:  Recent Labs Lab 10/25/15 1556 10/26/15 0541  WBC 10.7* 11.0*  NEUTROABS 8.1*  --   HGB 15.0 13.4  HCT 44.3 40.4  MCV 90.6 91.8  PLT 320 306   Cardiac Enzymes: No results for input(s): CKTOTAL, CKMB, CKMBINDEX,  TROPONINI in the last 168 hours. BNP: No results for input(s): PROBNP in the last 168 hours. D-Dimer: No results for input(s): DDIMER in the last 168 hours. CBG:  Recent Labs Lab 10/25/15 2354 10/26/15 0417 10/26/15 0809  GLUCAP 298* 256* 179*   Hemoglobin A1C: No results for input(s): HGBA1C in the last 168 hours.  Urinalysis:  Recent Labs Lab 10/25/15 1548  COLORURINE YELLOW  LABSPEC 1.033*  PHURINE 5.0  GLUCOSEU >1000*  HGBUR NEGATIVE  BILIRUBINUR NEGATIVE  KETONESUR 15*  PROTEINUR NEGATIVE  NITRITE NEGATIVE  LEUKOCYTESUR NEGATIVE    Micro Results: No results found for this or any previous visit (from the past 240 hour(s)). Studies/Results: US Breast Ltd Uni Right Inc Axilla  10/25/2015  CLINICAL DATA:  30 year old female left inflammatory changes and swelling of the right breast concerning for abscess. EXAM: ULTRASOUND OF THE right BREAST COMPARISON:  Previous exam(s). FINDINGS: On physical exam, right breast tenderness and inflammation noted Targeted sonographic images of the right breast in the region of the clinical concern in the upper inner quadrant demonstrates lobular appearing complex fluid collection with low-level internal echogenicity which may represent hematoma or abscess. No flow identified within these collections. The largest collection measures approximately 10 x 4 cm  and extends from the retroareolar region into the upper inner quadrant. These collection is approximately 5 mm from the skin surface. IMPRESSION: Multilobulated complex fluid collection in the right breast may represent hematoma or abscesses. Clinical correlation is recommended. Electronically Signed   By: Elgie Collard M.D.   On: 10/25/2015 20:19   Medications: I have reviewed the patient's current medications. Scheduled Meds: . heparin  5,000 Units Subcutaneous 3 times per day  . [START ON 10/27/2015] Influenza vac split quadrivalent PF  0.5 mL Intramuscular Tomorrow-1000  . insulin  aspart  0-9 Units Subcutaneous 6 times per day  . [START ON 10/27/2015] pneumococcal 23 valent vaccine  0.5 mL Intramuscular Tomorrow-1000   Continuous Infusions: . sodium chloride 1,000 mL (10/26/15 0411)   PRN Meds:.HYDROcodone-acetaminophen, ketorolac, ondansetron (ZOFRAN) IV **OR** ondansetron Assessment/Plan: Active Problems:   Abscess of breast   Breast abscess   Abscess of right breast  # Right Breast Abscess: patient still afebrile s/p 1 dose of clindamycin  IV yesterday in the ED.  Her WBC is only mildly elevated to 11.  She has history of previous abscess in same location several months ago s/p drainage.  Ultrasound done last night shows multi-lobulated fluid collection, largest of which is 10x4cm.  Failed outpatient doxycycline.  Discussed with interventional radiology who felt that surgical evacuation of abscess this size would be more appropriate.   - consulted general surgery, appreciate their assistance - hold off for now on further antibiotics as we attempt to gain source control - ketorolac  IV q6h as needed - hydrocodone-acetaminophen 5-325mg  1-2 tablets q6h as needed - ondansetron  q6h as needed  # Newly-Diagnosed T2DM: does have history of gestational DM that resolved before end of pregnancy, labs at PCP 6 days ago showed elevated glucose and CBG here 250 at admission. Patient is obese, has strong family history of T2DM. LIkely a risk factor for her recurrent breast abscess as well.  - A1c pending - sensitive SSI while NPO, consider increasing to moderate based on CBGs - consider discharging with metformin, f/u with PCP for further management  # FEN Fluids: NS at 160mL/hr Electrolytes: replace as needed Nutrition: NPO  # DVT PPx: Heparin  # Code:  FULL   Dispo: Disposition is deferred at this time, awaiting improvement of current medical problems.  Anticipated discharge in approximately 1-2 day(s).   The patient does have a current PCP ([facility]) and does not need an [facility] hospital follow-up appointment after discharge.  The patient does not have transportation limitations that hinder transportation to clinic appointments.  .Services Needed at time of discharge: Y = Yes, Blank = No PT:   OT:   RN:   Equipment:   Other:     LOS: 1 day   Gwynn Burly, DO 10/26/2015, 9:29 AM

## 2015-10-26 NOTE — Consult Note (Signed)
[facility] Consult Note  Alison Rowe 17-Apr-1986  [id number].    Requesting MD: Dr. Linus Salmons Chief Complaint/Reason for Consult: Right breast abscess  HPI:  30 y/o Hispanic female G1P1 with PMH of gestational DM and previous right breast abscess in August 2016 who presents with worsening right breast pain for the past 4 weeks.  4 weeks ago, she started having sharp/burning pain/swelling to her right nipple.  She had to wait 2 weeks to get an appointment with her PCP, but saw her a week ago and was started on doxycycline for suspected infection.  She had a PCP follow up planned for Monday, but the pain had become worse and it was more swollen and red so she proceeded to [facility] for evaluation on 10/25/15.  She admits to chills.  No breast discharge, no axillary pain or swelling, N/V/D, no fevers, headache, CP/SOB, or abdominal pain.  No precipitating/alleviating factors, no radicular pain.  In August 2016, she had drainage of a breast abscess on the same side.  She is not breast feeding (last child 3.5 years ago), denies any trauma/insect bites to the breast.  She is NPO.  H/o c-section and cholecystectomy.    ROS: All systems reviewed and otherwise negative except for as above  Family History  Problem Relation Age of Onset  . Diabetes Mother   . Diabetes Father   . Hyperlipidemia Father   . Hypertension Father   . Diabetes Maternal Grandmother     Past Medical History  Diagnosis Date  . Depression   . Gestational diabetes     Past Surgical History  Procedure Laterality Date  . Cesarean section    . Gallbladder surgery      Social History:  reports that she has never smoked. She does not have any smokeless tobacco history on file. She reports that she drinks alcohol. She reports that she does not use illicit drugs.  Allergies: No Known Allergies  Medications Prior to Admission  Medication Sig Dispense Refill  . acetaminophen-codeine (TYLENOL #3) 300-30 MG tablet Take  by mouth every 4 (four) hours as needed for moderate pain.    Marland Kitchen doxycycline (VIBRA-TABS) 100 MG tablet Take 1 tablet by mouth 2 (two) times daily.  0  . fluconazole (DIFLUCAN) 150 MG tablet Take 1 tablet by mouth as needed. Yeast infection  0  . HYDROcodone-acetaminophen (NORCO) 10-325 MG tablet Take 1 tablet by mouth every 6 (six) hours as needed.    Marland Kitchen ibuprofen (ADVIL,MOTRIN) 600 MG tablet Take by mouth every 6 (six) hours as needed.    Marland Kitchen levonorgestrel (MIRENA) 20 MCG/24HR IUD 1 each by Intrauterine route once.    Marland Kitchen acetaminophen (TYLENOL) 500 MG tablet Take 500 mg by mouth every 6 (six) hours as needed.    Marland Kitchen HYDROcodone-acetaminophen (NORCO/VICODIN) 5-325 MG tablet Take 2 tablets by mouth every 4 (four) hours as needed. 16 tablet 0  . naproxen (NAPROSYN) 500 MG tablet Take 500 mg by mouth daily as needed.    Marland Kitchen VITAMIN E PO Take by mouth.      Blood pressure 99/66, pulse 93, temperature 98 F (36.7 C), temperature source Oral, resp. rate 19, height '5\' 8"'  (1.727 m), weight 89.869 kg (198 lb 2 oz), last menstrual period 10/25/2015, SpO2 96 %. Physical Exam: General: pleasant, WD/WN Hispanic female who is laying in bed in NAD HEENT: head is normocephalic, atraumatic.  Sclera are noninjected.  PERRL.  Ears and nose without any masses or lesions.  Mouth is pink and  moist Heart: regular, rate, and rhythm.  No obvious murmurs, gallops, or rubs noted.  Palpable pedal pulses bilaterally Lungs: CTAB, no wheezes, rhonchi, or rales noted.  Respiratory effort nonlabored Right breast:  10-12cm span of erythema, induration and fluctuance palpable.  The center is under the right nipple.  No axillary tenderness.  No streaking erythema noted.   Abd: soft, NT/ND, +BS, no masses, hernias, or organomegaly MS: all 4 extremities are symmetrical with no cyanosis, clubbing, or edema. Skin: warm and dry with no masses, lesions, or rashes Psych: A&Ox3 with an appropriate affect.   Results for orders placed or  performed during the hospital encounter of 10/25/15 (from the past 48 hour(s))  Urinalysis, Routine w reflex microscopic (not at [facility])     Status: Abnormal   Collection Time: 10/25/15  3:48 PM  Result Value Ref Range   Color, Urine YELLOW YELLOW   APPearance CLEAR CLEAR   Specific Gravity, Urine 1.033 (H) 1.005 - 1.030   pH 5.0 5.0 - 8.0   Glucose, UA >1000 (A) NEGATIVE mg/dL   Hgb urine dipstick NEGATIVE NEGATIVE   Bilirubin Urine NEGATIVE NEGATIVE   Ketones, ur 15 (A) NEGATIVE mg/dL   Protein, ur NEGATIVE NEGATIVE mg/dL   Nitrite NEGATIVE NEGATIVE   Leukocytes, UA NEGATIVE NEGATIVE  Urine microscopic-add on     Status: Abnormal   Collection Time: 10/25/15  3:48 PM  Result Value Ref Range   Squamous Epithelial / LPF 0-5 (A) NONE SEEN   WBC, UA 0-5 0 - 5 WBC/hpf   RBC / HPF 0-5 0 - 5 RBC/hpf   Bacteria, UA RARE (A) NONE SEEN  Comprehensive metabolic panel     Status: Abnormal   Collection Time: 10/25/15  3:56 PM  Result Value Ref Range   Sodium 133 (L) 135 - 145 mmol/L   Potassium 4.0 3.5 - 5.1 mmol/L   Chloride 96 (L) 101 - 111 mmol/L   CO2 25 22 - 32 mmol/L   Glucose, Bld 250 (H) 65 - 99 mg/dL   BUN 5 (L) 6 - 20 mg/dL   Creatinine, Ser 0.57 0.44 - 1.00 mg/dL   Calcium 9.3 8.9 - 10.3 mg/dL   Total Protein 8.2 (H) 6.5 - 8.1 g/dL   Albumin 3.9 3.5 - 5.0 g/dL   AST 22 15 - 41 U/L   ALT 25 14 - 54 U/L   Alkaline Phosphatase 78 38 - 126 U/L   Total Bilirubin 0.7 0.3 - 1.2 mg/dL   GFR calc non Af Amer >60 >60 mL/min   GFR calc Af Amer >60 >60 mL/min    Comment: (NOTE) The eGFR has been calculated using the CKD EPI equation. This calculation has not been validated in all clinical situations. eGFR's persistently <60 mL/min signify possible Chronic Kidney Disease.    Anion gap 12 5 - 15  CBC with Differential     Status: Abnormal   Collection Time: 10/25/15  3:56 PM  Result Value Ref Range   WBC 10.7 (H) 4.0 - 10.5 K/uL   RBC 4.89 3.87 - 5.11 MIL/uL   Hemoglobin 15.0 12.0  - 15.0 g/dL   HCT 44.3 36.0 - 46.0 %   MCV 90.6 78.0 - 100.0 fL   MCH 30.7 26.0 - 34.0 pg   MCHC 33.9 30.0 - 36.0 g/dL   RDW 12.4 11.5 - 15.5 %   Platelets 320 150 - 400 K/uL   Neutrophils Relative % 76 %   Neutro Abs 8.1 (H) 1.7 -  7.7 K/uL   Lymphocytes Relative 21 %   Lymphs Abs 2.2 0.7 - 4.0 K/uL   Monocytes Relative 3 %   Monocytes Absolute 0.3 0.1 - 1.0 K/uL   Eosinophils Relative 0 %   Eosinophils Absolute 0.0 0.0 - 0.7 K/uL   Basophils Relative 0 %   Basophils Absolute 0.0 0.0 - 0.1 K/uL  I-Stat CG4 Lactic Acid, ED     Status: None   Collection Time: 10/25/15  4:05 PM  Result Value Ref Range   Lactic Acid, Venous 1.19 0.5 - 2.0 mmol/L  POC Urine Pregnancy, ED (do NOT order at [facility])     Status: None   Collection Time: 10/25/15  4:20 PM  Result Value Ref Range   Preg Test, Ur NEGATIVE NEGATIVE    Comment:        THE SENSITIVITY OF THIS METHODOLOGY IS >24 mIU/mL   I-Stat CG4 Lactic Acid, ED     Status: None   Collection Time: 10/25/15  7:00 PM  Result Value Ref Range   Lactic Acid, Venous 1.18 0.5 - 2.0 mmol/L  Glucose, capillary     Status: Abnormal   Collection Time: 10/25/15 11:54 PM  Result Value Ref Range   Glucose-Capillary 298 (H) 65 - 99 mg/dL  Glucose, capillary     Status: Abnormal   Collection Time: 10/26/15  4:17 AM  Result Value Ref Range   Glucose-Capillary 256 (H) 65 - 99 mg/dL  Basic metabolic panel     Status: Abnormal   Collection Time: 10/26/15  5:41 AM  Result Value Ref Range   Sodium 139 135 - 145 mmol/L   Potassium 3.7 3.5 - 5.1 mmol/L   Chloride 104 101 - 111 mmol/L   CO2 25 22 - 32 mmol/L   Glucose, Bld 243 (H) 65 - 99 mg/dL   BUN 9 6 - 20 mg/dL   Creatinine, Ser 0.54 0.44 - 1.00 mg/dL   Calcium 9.1 8.9 - 10.3 mg/dL   GFR calc non Af Amer >60 >60 mL/min   GFR calc Af Amer >60 >60 mL/min    Comment: (NOTE) The eGFR has been calculated using the CKD EPI equation. This calculation has not been validated in all clinical  situations. eGFR's persistently <60 mL/min signify possible Chronic Kidney Disease.    Anion gap 10 5 - 15  CBC     Status: Abnormal   Collection Time: 10/26/15  5:41 AM  Result Value Ref Range   WBC 11.0 (H) 4.0 - 10.5 K/uL   RBC 4.40 3.87 - 5.11 MIL/uL   Hemoglobin 13.4 12.0 - 15.0 g/dL   HCT 40.4 36.0 - 46.0 %   MCV 91.8 78.0 - 100.0 fL   MCH 30.5 26.0 - 34.0 pg   MCHC 33.2 30.0 - 36.0 g/dL   RDW 12.5 11.5 - 15.5 %   Platelets 306 150 - 400 K/uL  Glucose, capillary     Status: Abnormal   Collection Time: 10/26/15  8:09 AM  Result Value Ref Range   Glucose-Capillary 179 (H) 65 - 99 mg/dL   US Breast Ltd Uni Right Inc Axilla  10/25/2015  CLINICAL DATA:  30 year old female left inflammatory changes and swelling of the right breast concerning for abscess. EXAM: ULTRASOUND OF THE right BREAST COMPARISON:  Previous exam(s). FINDINGS: On physical exam, right breast tenderness and inflammation noted Targeted sonographic images of the right breast in the region of the clinical concern in the upper inner quadrant demonstrates lobular appearing complex fluid  collection with low-level internal echogenicity which may represent hematoma or abscess. No flow identified within these collections. The largest collection measures approximately 10 x 4 cm and extends from the retroareolar region into the upper inner quadrant. These collection is approximately 5 mm from the skin surface. IMPRESSION: Multilobulated complex fluid collection in the right breast may represent hematoma or abscesses. Clinical correlation is recommended. Electronically Signed   By: Anner Crete M.D.   On: 10/25/2015 20:19      Assessment/Plan Right breast abscess 10cm x 4cm by ultrasound  Plan: 1.  Plan for OR today for I&D right breast abscess 2.  NPO, IVF, pain control, antibiotics (Zosyn, can tailor after cultures obtained) 3.  SCD's 4.  Discussed risks and benefits of surgery, risk of bleeding/infection,  re-operation, open wound.  The patient understands and would like to proceed with surgery.   Nat Christen, [facility] 10/26/2015, 10:51 AM Pager: [phone]

## 2015-10-26 NOTE — Transfer of Care (Signed)
Immediate Anesthesia Transfer of Care Note  Patient: Alison Rowe  Procedure(s) Performed: Procedure(s): IRRIGATION AND DEBRIDEMENT BREAST  (Right)  Patient Location: PACU  Anesthesia Type:General  Level of Consciousness: awake  Airway & Oxygen Therapy: Patient Spontanous Breathing  Post-op Assessment: Report given to RN and Post -op Vital signs reviewed and stable  Post vital signs: Reviewed and stable  Last Vitals:  Filed Vitals:   10/26/15 0553 10/26/15 1127  BP: 99/66 112/72  Pulse: 93 91  Temp: 36.7 C 36.9 C  Resp: 19 18    Complications: No apparent anesthesia complications

## 2015-10-26 NOTE — Anesthesia Procedure Notes (Signed)
Procedure Name: LMA Insertion Date/Time: 10/26/2015 12:12 PM Performed by: Alanda Amass A Pre-anesthesia Checklist: Patient identified, Timeout performed, Emergency Drugs available, Suction available and Patient being monitored Patient Re-evaluated:Patient Re-evaluated prior to inductionOxygen Delivery Method: Circle system utilized Preoxygenation: Pre-oxygenation with 100% oxygen Intubation Type: IV induction LMA: LMA inserted LMA Size: 4.0 Number of attempts: 1 Placement Confirmation: positive ETCO2 and breath sounds checked- equal and bilateral Tube secured with: Tape Dental Injury: Teeth and Oropharynx as per pre-operative assessment

## 2015-10-26 NOTE — Anesthesia Preprocedure Evaluation (Addendum)
Anesthesia Evaluation  Patient identified by MRN, date of birth, ID band Patient awake    Reviewed: Allergy & Precautions, NPO status , Patient's Chart, lab work & pertinent test results  History of Anesthesia Complications Negative for: history of anesthetic complications  Airway Mallampati: II  TM Distance: >3 FB Neck ROM: Full    Dental  (+) Dental Advisory Given, Teeth Intact   Pulmonary neg pulmonary ROS,    breath sounds clear to auscultation       Cardiovascular negative cardio ROS   Rhythm:Regular     Neuro/Psych PSYCHIATRIC DISORDERS Depression negative neurological ROS     GI/Hepatic negative GI ROS, Neg liver ROS,   Endo/Other  diabetes  Renal/GU negative Renal ROS     Musculoskeletal   Abdominal   Peds  Hematology negative hematology ROS (+)   Anesthesia Other Findings   Reproductive/Obstetrics                            Anesthesia Physical Anesthesia Plan  ASA: II  Anesthesia Plan: General   Post-op Pain Management:    Induction: Intravenous  Airway Management Planned: LMA  Additional Equipment: None  Intra-op Plan:   Post-operative Plan: Extubation in OR  Informed Consent: I have reviewed the patients History and Physical, chart, labs and discussed the procedure including the risks, benefits and alternatives for the proposed anesthesia with the patient or authorized representative who has indicated his/her understanding and acceptance.   Dental advisory given  Plan Discussed with: CRNA and Surgeon  Anesthesia Plan Comments:         Anesthesia Quick Evaluation

## 2015-10-26 NOTE — Anesthesia Postprocedure Evaluation (Signed)
Anesthesia Post Note  Patient: Alison Rowe  Procedure(s) Performed: Procedure(s) (LRB): IRRIGATION AND DEBRIDEMENT BREAST  (Right)  Patient location during evaluation: PACU Anesthesia Type: General Level of consciousness: awake Pain management: pain level controlled Vital Signs Assessment: post-procedure vital signs reviewed and stable Respiratory status: spontaneous breathing Cardiovascular status: stable Postop Assessment: no signs of nausea or vomiting Anesthetic complications: no    Last Vitals:  Filed Vitals:   10/26/15 1410 10/26/15 1816  BP: 101/64 119/69  Pulse: 81 102  Temp: 36.6 C 36.7 C  Resp: 16 16    Last Pain:  Filed Vitals:   10/26/15 1817  PainSc: 2                  Peggy Monk

## 2015-10-26 NOTE — Op Note (Signed)
10/26/2015  12:27 PM  PATIENT:  Alison Rowe  30 y.o. female  PRE-OPERATIVE DIAGNOSIS:  Right breast abscess  POST-OPERATIVE DIAGNOSIS:  Right breast abscess  PROCEDURE:  Procedure(s): Incision and Drainage of BREAST  (Right)  SURGEON:  Surgeon(s) and Role:    * Axel Filler, MD - Primary  ANESTHESIA:   local and general  EBL: 10cc   Total I/O In: 500 [I.V.:500] Out: 450 [Urine:450]  BLOOD ADMINISTERED:none  DRAINS: Quarter-inch iodoform gauze   LOCAL MEDICATIONS USED:  BUPIVICAINE   SPECIMEN:  Source of Specimen:  Right breast abscess  DISPOSITION OF SPECIMEN:  Micro-  COUNTS:  YES  TOURNIQUET:  * No tourniquets in log *  DICTATION: .Dragon Dictation After the patient was consented she was taken back to the operating room and placed in the supine position with bilateral SCDs in place. Patient underwent general endotracheal anesthesia. Patient was prepped and draped in the usual sterile fashion. A timeout was called all facts verified.  A 3 cm curvilinear incision was made in the medial aspect of the breast in the area of greatest fluctuance. Electrocautery used to maintain hemostasis dissection was taken down to the abscess cavity. There is large amount of purulence that was expressed. Cultures were taken of this cavity. Blunt dissection was used to break up any loculations. The area was irrigated out with sterile saline. 1/4 inch iodoform gauze was then placed into the wound as packing. The wound was dressed with 4 x 4's, AVD pad, and breast binder. The patient tolerated the procedure well was taken to the recovery room stable condition.  PLAN OF CARE: Admit for overnight observation  PATIENT DISPOSITION:  PACU - hemodynamically stable.   Delay start of Pharmacological VTE agent (>24hrs) due to surgical blood loss or risk of bleeding: no

## 2015-10-27 ENCOUNTER — Encounter (HOSPITAL_COMMUNITY): Payer: Self-pay | Admitting: General Surgery

## 2015-10-27 LAB — GLUCOSE, CAPILLARY
GLUCOSE-CAPILLARY: 226 mg/dL — AB (ref 65–99)
GLUCOSE-CAPILLARY: 184 mg/dL — AB (ref 65–99)
Glucose-Capillary: 217 mg/dL — ABNORMAL HIGH (ref 65–99)

## 2015-10-27 LAB — HEMOGLOBIN A1C
HEMOGLOBIN A1C: 11.1 % — AB (ref 4.8–5.6)
MEAN PLASMA GLUCOSE: 272 mg/dL

## 2015-10-27 MED ORDER — LIVING WELL WITH DIABETES BOOK
Freq: Once | Status: AC
Start: 2015-10-27 — End: 2015-10-27
  Administered 2015-10-27: 13:00:00
  Filled 2015-10-27: qty 1

## 2015-10-27 MED ORDER — HYDROCODONE-ACETAMINOPHEN 5-325 MG PO TABS: 1.0000 | ORAL_TABLET | Freq: Four times a day (QID) | ORAL | Status: DC | PRN

## 2015-10-27 MED ORDER — SULFAMETHOXAZOLE-TRIMETHOPRIM 400-80 MG PO TABS: ORAL_TABLET | ORAL | Status: DC

## 2015-10-27 MED ORDER — SULFAMETHOXAZOLE-TRIMETHOPRIM 400-80 MG PO TABS
1.0000 | ORAL_TABLET | Freq: Two times a day (BID) | ORAL | Status: DC
  Administered 2015-10-27: 1 via ORAL
  Filled 2015-10-27 (×2): qty 1

## 2015-10-27 MED ORDER — METFORMIN HCL 500 MG PO TABS: 500.0000 mg | ORAL_TABLET | Freq: Two times a day (BID) | ORAL | Status: DC

## 2015-10-27 NOTE — Care Management Note (Signed)
Case Management Note  Patient Details  Name: Alison Rowe MRN: [id number] Date of Birth: July 08, 1986  Subjective/Objective:                    Action/Plan:  Face sheet confirmed . Patient believes she can be taught how to change dressing herself. Her mother may be coming to visit also.  Expected Discharge Date:  10/29/15               Expected Discharge Plan:  Home w Home Health Services  In-House Referral:     Discharge planning Services  CM Consult  Post Acute Care Choice:  Home Health Choice offered to:  Patient  DME Arranged:    DME Agency:     HH Arranged:  RN HH Agency:  [facility]  Status of Service:  Completed, signed off  Medicare Important Message Given:    Date Medicare IM Given:    Medicare IM give by:    Date Additional Medicare IM Given:    Additional Medicare Important Message give by:     If discussed at Long Length of Stay Meetings, dates discussed:    Additional Comments:  Kingsley Plan, RN 10/27/2015, 11:41 AM

## 2015-10-27 NOTE — Progress Notes (Signed)
Patient discharged to home with instructions given, verbalized understanding. 

## 2015-10-27 NOTE — Progress Notes (Addendum)
Inpatient Diabetes Program Recommendations  AACE/ADA: New Consensus Statement on Inpatient Glycemic Control (2015)  Target Ranges:  Prepandial:   less than 140 mg/dL      Peak postprandial:   less than 180 mg/dL (1-2 hours)      Critically ill patients:  140 - 180 mg/dL   Results for Alison Rowe, Alison Rowe (MRN [id number]) as of 10/27/2015 11:40  Ref. Range 10/26/2015 08:09 10/26/2015 11:17 10/26/2015 12:57 10/26/2015 17:23 10/26/2015 21:42  Glucose-Capillary Latest Ref Range: 65-99 mg/dL 782 (H) 956 (H) 213 (H) 208 (H) 202 (H)    Results for Alison Rowe, Alison Rowe (MRN [id number]) as of 10/27/2015 11:40  Ref. Range 10/26/2015 05:41  Hemoglobin A1C Latest Ref Range: 4.8-5.6 % 11.1 (H)    Admit with: Breast Abscess  History: Gestational DM  Home DM Meds: None  Current Insulin Orders: Novolog Sensitive SSI (0-9 units) Q4 hours     Spoke with pt about new diagnosis.  Explained what an A1C is, basic pathophysiology of DM Type 2, basic home care, basic diabetes diet nutrition principles, importance of checking CBGs and maintaining good CBG control to prevent long-term and short-term complications.  Reviewed blood sugar goals at home and also discussed with patient what Metformin is, how to take, side effects, etc.   RNs to provide ongoing basic DM education at bedside with this patient.  Have ordered educational booklet and DM videos.  Have also placed RD consult for DM diet education for this patient.  Patient very familiar with DM information as she had Gestational DM.  Had more specific questions about diet/nutrition at home.  Reviewed very basic DM diet information but have asked RD to visit with patient today before d/c to review DM diet education in more depth.  Patient knows how to check CBGs at home.  Will need CBG meter Rx.    --Will follow patient during hospitalization--  Ambrose Finland RN, MSN, CDE Diabetes Coordinator Inpatient Glycemic Control Team Team Pager: [phone]  (8a-5p)

## 2015-10-27 NOTE — Progress Notes (Signed)
[facility] Progress Note  1 Day Post-Op  Subjective: Pt's pain much improved.  Tolerated dressing change.  Ambulating well.  Tolerating diet.  No N/V.    Objective: Vital signs in last 24 hours: Temp:  [97.9 F (36.6 C)-98.7 F (37.1 C)] 98.1 F (36.7 C) (02/13 0516) Pulse Rate:  [81-102] 88 (02/13 0530) Resp:  [12-18] 16 (02/13 0516) BP: (81-119)/(46-74) 94/60 mmHg (02/13 0530) SpO2:  [92 %-99 %] 98 % (02/13 0516) Last BM Date: 10/26/15  Intake/Output from previous day: 02/12 0701 - 02/13 0700 In: 4162.1 [P.O.:960; I.V.:3202.1] Out: 1105 [Urine:1100; Blood:5] Intake/Output this shift: Total I/O In: 240 [P.O.:240] Out: -   PE: Gen:  Alert, NAD, pleasant Right breast: 3cm curvilinear incision with large cavity, 2 feet of bloody 1/4 inch packing removed.  Patient tolerated this well.  No significant induration, fluctuance, or erythema.  No purulent drainage.   Lab Results:   Recent Labs  10/25/15 1556 10/26/15 0541  WBC 10.7* 11.0*  HGB 15.0 13.4  HCT 44.3 40.4  PLT 320 306   BMET  Recent Labs  10/25/15 1556 10/26/15 0541  NA 133* 139  K 4.0 3.7  CL 96* 104  CO2 25 25  GLUCOSE 250* 243*  BUN 5* 9  CREATININE 0.57 0.54  CALCIUM 9.3 9.1   PT/INR No results for input(s): LABPROT, INR in the last 72 hours. CMP     Component Value Date/Time   NA 139 10/26/2015 0541   K 3.7 10/26/2015 0541   CL 104 10/26/2015 0541   CO2 25 10/26/2015 0541   GLUCOSE 243* 10/26/2015 0541   BUN 9 10/26/2015 0541   CREATININE 0.54 10/26/2015 0541   CALCIUM 9.1 10/26/2015 0541   PROT 8.2* 10/25/2015 1556   ALBUMIN 3.9 10/25/2015 1556   AST 22 10/25/2015 1556   ALT 25 10/25/2015 1556   ALKPHOS 78 10/25/2015 1556   BILITOT 0.7 10/25/2015 1556   GFRNONAA >60 10/26/2015 0541   GFRAA >60 10/26/2015 0541   Lipase  No results found for: LIPASE     Studies/Results: US Breast Ltd Uni Right Inc Axilla  10/25/2015  CLINICAL DATA:  30 year old female left  inflammatory changes and swelling of the right breast concerning for abscess. EXAM: ULTRASOUND OF THE right BREAST COMPARISON:  Previous exam(s). FINDINGS: On physical exam, right breast tenderness and inflammation noted Targeted sonographic images of the right breast in the region of the clinical concern in the upper inner quadrant demonstrates lobular appearing complex fluid collection with low-level internal echogenicity which may represent hematoma or abscess. No flow identified within these collections. The largest collection measures approximately 10 x 4 cm and extends from the retroareolar region into the upper inner quadrant. These collection is approximately 5 mm from the skin surface. IMPRESSION: Multilobulated complex fluid collection in the right breast may represent hematoma or abscesses. Clinical correlation is recommended. Electronically Signed   By: Elgie Collard M.D.   On: 10/25/2015 20:19    Anti-infectives: Anti-infectives    Start     Dose/Rate Route Frequency Ordered Stop   10/27/15 1000  sulfamethoxazole-trimethoprim (BACTRIM,SEPTRA) 400-80 MG per tablet 1 tablet     1 tablet Oral Every 12 hours 10/27/15 0711     10/26/15 1200  piperacillin-tazobactam (ZOSYN) IVPB 3.375 g  Status:  Discontinued     3.375 g 12.5 mL/hr over 240 Minutes Intravenous 3 times per day 10/26/15 1110 10/27/15 0711   10/25/15 1830  clindamycin (CLEOCIN) IVPB 900 mg  900 mg 100 mL/hr over 30 Minutes Intravenous  Once 10/25/15 1815 10/25/15 2029       Assessment/Plan Right breast abscess POD #1 s/p I&D -1/4 inch iodoform gauze packing strips - about 2 feet of packing currently -I'm not sure the husband could be much help since he was unable to tolerate watching the dressing change today -Hopefully primary service can arrange for [facility].  She will need daily dressing changes.   -I have recommended that she also try to shower with wound unpacked to help in cleaning out the cavity -Cultures pending,  given size of abscess it would be reasonable to finish out a 5-7 day course of antibiotics. -F/u with Dr. Derrell Lolling in 2-3 weeks for a wound check    LOS: 2 days    Nonie Hoyer 10/27/2015, 9:39 AM Pager: [phone]

## 2015-10-27 NOTE — Progress Notes (Signed)
Patient ID: Alison Rowe, female   DOB: June 16, 1986, 30 y.o.   MRN: [id number]   Subjective: Alison Rowe pain is vastly improved after undergoing surgery yesterday. Any pain she is having is well-controlled on oral medications, and she is eating and drinking well.  Objective: Vital signs in last 24 hours: Filed Vitals:   10/27/15 0131 10/27/15 0140 10/27/15 0516 10/27/15 0530  BP: 94/60  Pulse: 98 84 85 88  Temp: 98.3 F (36.8 C)  98.1 F (36.7 C)   TempSrc: Oral  Oral   Resp: 16  16   Height:      Weight:      SpO2: 98%  98%    Physical exam: General: resting in bed comfortably, appropriately conversational Cardiac: regular rate and rhythm, no rubs, murmurs or gallops Pulm: breathing well, clear to auscultation bilaterally Ext: warm and well perfused, without pedal edema Skin: breast bandaged without adjacent erythema  Lab Results: Basic Metabolic Panel:  Recent Labs Lab 10/25/15 1556 10/26/15 0541  NA 133* 139  K 4.0 3.7  CL 96* 104  CO2 25 25  GLUCOSE 250* 243*  BUN 5* 9  CREATININE 0.57 0.54  CALCIUM 9.3 9.1   Liver Function Tests:  Recent Labs Lab 10/25/15 1556  AST 22  ALT 25  ALKPHOS 78  BILITOT 0.7  PROT 8.2*  ALBUMIN 3.9   CBC:  Recent Labs Lab 10/25/15 1556 10/26/15 0541  WBC 10.7* 11.0*  NEUTROABS 8.1*  --   HGB 15.0 13.4  HCT 44.3 40.4  MCV 90.6 91.8  PLT 320 306   CBG:  Recent Labs Lab 10/26/15 1257 10/26/15 1723 10/26/15 2142 10/26/15 2358 10/27/15 0507 10/27/15 0748  GLUCAP 180* 208* 202* 169* 226* 184*   Medications: I have reviewed the patient's current medications. Scheduled Meds: . Influenza vac split quadrivalent PF  0.5 mL Intramuscular Tomorrow-1000  . insulin aspart  0-9 Units Subcutaneous 6 times per day  . pneumococcal 23 valent vaccine  0.5 mL Intramuscular Tomorrow-1000  . sulfamethoxazole-trimethoprim  1 tablet Oral Q12H   Continuous Infusions: . sodium chloride 1,000 mL (10/27/15  0559)   PRN Meds:.HYDROcodone-acetaminophen, ketorolac, ondansetron (ZOFRAN) IV **OR** ondansetron   Assessment/Plan:  Right Breast Abscess: She underwent I&D yesterday without complication. We'll send her home with a 7 day course of Bactrim with home health nursing. Bactrim should be okay as has an IUD in place. -Stopped pipercillin/tazobactam -Started Bactrim -Home health nursing for dressing changes  Newly-Diagnosed T2DM: Likely contributing to development of her breast abscess. She'll need close outpatient follow-up. I'll start her on metformin upon discharge but I suspect her a1c will be above 9 so she may need injectable agents. -Follow-up a1c on outpatient basis -Will discharge on metformin  twice daily  Dispo: Disposition is deferred at this time, awaiting improvement of current medical problems.  Anticipated discharge in approximately 0 day(s).   The patient does have a current PCP ([facility]) and does need an [facility] hospital follow-up appointment after discharge.  The patient does not know have transportation limitations that hinder transportation to clinic appointments.  .Services Needed at time of discharge: Y = Yes, Blank = No PT:   OT:   RN:   Equipment:   Other:     LOS: 2 days   Selina Cooley, MD 10/27/2015, 8:45 AM

## 2015-10-27 NOTE — Discharge Summary (Signed)
Name: Alison Rowe MRN: [id number] DOB: [birth year] 30 y.o. PCP: [facility]  Date of Admission: 10/25/2015  5:35 PM Date of Discharge: 10/27/2015 Attending Physician: Gardiner Barefoot, MD  Discharge Diagnosis: 1. Right breast abscess 2. Newly diagnosed type 2 diabetes  Discharge Medications:   Medication List    STOP taking these medications        acetaminophen 500 MG tablet  Commonly known as:  TYLENOL     acetaminophen-codeine 300-30 MG tablet  Commonly known as:  TYLENOL #3     doxycycline 100 MG tablet  Commonly known as:  VIBRA-TABS     ibuprofen 600 MG tablet  Commonly known as:  ADVIL,MOTRIN     naproxen 500 MG tablet  Commonly known as:  NAPROSYN     VITAMIN E PO      TAKE these medications        fluconazole 150 MG tablet  Commonly known as:  DIFLUCAN  Take 1 tablet by mouth as needed. Yeast infection     HYDROcodone-acetaminophen 5-325 MG tablet  Commonly known as:  NORCO/VICODIN  Take 2 tablets by mouth every 4 (four) hours as needed.     levonorgestrel 20 MCG/24HR IUD  Commonly known as:  MIRENA  1 each by Intrauterine route once.     metFORMIN 500 MG tablet  Commonly known as:  GLUCOPHAGE  Take 1 tablet (500 mg total) by mouth 2 (two) times daily with a meal.     sulfamethoxazole-trimethoprim 400-80 MG tablet  Commonly known as:  BACTRIM,SEPTRA  Take 1 tablet twice daily until 2/17       Disposition and follow-up:   Alison Rowe was [facility] from [facility] in Good condition.  At the hospital follow up visit please address:  1. Her breast abscess is healing well  2. Her a1c was 11.1; consider starting Bryetta or basal insulin  Follow-up Appointments:     Follow-up Information    Follow up with [facility]. Call in 1 day.   Why:  Hospital follow-up   Contact information:   [address] [place] [address] [phone]       Discharge Instructions: Discharge  Instructions    Call MD for:  redness, tenderness, or signs of infection (pain, swelling, redness, odor or green/yellow discharge around incision site)    Complete by:  As directed      Call MD for:  temperature >100.4    Complete by:  As directed      Diet - low sodium heart healthy    Complete by:  As directed      Increase activity slowly    Complete by:  As directed            Consultations:  Treatment Team:  Md Montez Morita, MD  Procedures Performed:  US Breast Ltd Uni Right Inc Axilla  10/25/2015  CLINICAL DATA:  30 year old female left inflammatory changes and swelling of the right breast concerning for abscess. EXAM: ULTRASOUND OF THE right BREAST COMPARISON:  Previous exam(s). FINDINGS: On physical exam, right breast tenderness and inflammation noted Targeted sonographic images of the right breast in the region of the clinical concern in the upper inner quadrant demonstrates lobular appearing complex fluid collection with low-level internal echogenicity which may represent hematoma or abscess. No flow identified within these collections. The largest collection measures approximately 10 x 4 cm and extends from the retroareolar region into the upper inner quadrant. These  collection is approximately 5 mm from the skin surface. IMPRESSION: Multilobulated complex fluid collection in the right breast may represent hematoma or abscesses. Clinical correlation is recommended. Electronically Signed   By: Elgie Collard M.D.   On: 10/25/2015 20:19   Admission HPI:   Alison Rowe is a 29yo with PMH of gestational DM and previous right breast abscess who presents with worsening right breast pain for the past 4 weeks. She says 4 weeks ago, she started having sharp and burning pain deep to her right nipple as well as underlying 'lumps'. Over the past few weeks, she has noticed that the pain has gotten worse, the lumps have increased in size, and the overlying breast skin has become warm and red. She went to  see her new PCP 6 days ago, who started her on doxycycline. However, her symptoms have continued to get worse since then, and she started feeling subjective 'hot flashes' and chills since yesterday which prompted her to come in. She had these identical symptoms in the same area of her breast back in 05/2015 months ago, which she said was relieved with drainage. She is not breast feeding, denies any trauma to the breast. She denies other symptoms, including nipple discharge, headache, myalgias, chest pain, SOB, cough, N/V/D, abdominal pain, or rashes.  She is a G1P1, last pregnancy ~3 1/2 years ago complicated by GDM t/w insulin, resolved before end of pregnancy, birth via C-section. LMP today. Has IUD in place for [facility]. H/o yeast infections, but no h/o STDs. Non-smoker, denies EtOH or rec drug use.  Hospital Course by problem list:   1. Right breast abscess: This was surgically debrided on 2/12 without complication and she was sent home on a 7 day course of Bactrim. She has an IUD in place. She was sent home with home health nursing to assist with dressing changes as her husband nearly passed out after he saw the incision.  2. Newly diagnosed type 2 diabetes: She has recurrent breast abscesses; her hemoglobin was 11.1. She was started on metformin but she'll likely need injectable Bryetta or insulin.  Discharge Vitals:   BP 94/60 mmHg  Pulse 88  Temp(Src) 98.1 F (36.7 C) (Oral)  Resp 16  Ht  (1.727 m)  Wt 89.869 kg (198 lb 2 oz)  BMI 30.13 kg/m2  SpO2 98%  LMP 10/25/2015  Discharge Labs:  Results for orders placed or performed during the hospital encounter of 10/25/15 (from the past 24 hour(s))  Glucose, capillary     Status: Abnormal   Collection Time: 10/26/15  5:23 PM  Result Value Ref Range   Glucose-Capillary 208 (H) 65 - 99 mg/dL  Glucose, capillary     Status: Abnormal   Collection Time: 10/26/15  9:42 PM  Result Value Ref Range   Glucose-Capillary 202 (H) 65 - 99 mg/dL    Glucose, capillary     Status: Abnormal   Collection Time: 10/26/15 11:58 PM  Result Value Ref Range   Glucose-Capillary 169 (H) 65 - 99 mg/dL  Glucose, capillary     Status: Abnormal   Collection Time: 10/27/15  5:07 AM  Result Value Ref Range   Glucose-Capillary 226 (H) 65 - 99 mg/dL  Glucose, capillary     Status: Abnormal   Collection Time: 10/27/15  7:48 AM  Result Value Ref Range   Glucose-Capillary 184 (H) 65 - 99 mg/dL   Comment 1 Notify RN   Glucose, capillary     Status: Abnormal   Collection  Time: 10/27/15 11:49 AM  Result Value Ref Range   Glucose-Capillary 217 (H) 65 - 99 mg/dL   Comment 1 Notify RN     Signed: Selina Cooley, MD 10/27/2015, 1:06 PM   Services Ordered on Discharge: Home health nursing

## 2015-10-30 LAB — CULTURE, ROUTINE-ABSCESS

## 2015-10-31 LAB — ANAEROBIC CULTURE: Gram Stain: NONE SEEN

## 2015-12-09 ENCOUNTER — Encounter (HOSPITAL_COMMUNITY)
Admission: RE | Admit: 2015-12-09 | Discharge: 2015-12-09 | Disposition: A | Discharge status: Home / Self Care | Payer: Medicaid Other | Source: Ambulatory Visit | Attending: General Surgery | Admitting: General Surgery

## 2015-12-09 ENCOUNTER — Other Ambulatory Visit: Payer: Self-pay | Admitting: General Surgery

## 2015-12-09 ENCOUNTER — Encounter (HOSPITAL_COMMUNITY): Payer: Self-pay

## 2015-12-09 DIAGNOSIS — Z01812 Encounter for preprocedural laboratory examination: Secondary | ICD-10-CM | POA: Insufficient documentation

## 2015-12-09 DIAGNOSIS — N611 Abscess of the breast and nipple: Secondary | ICD-10-CM | POA: Diagnosis not present

## 2015-12-09 DIAGNOSIS — Z01818 Encounter for other preprocedural examination: Secondary | ICD-10-CM | POA: Insufficient documentation

## 2015-12-09 DIAGNOSIS — E119 Type 2 diabetes mellitus without complications: Secondary | ICD-10-CM | POA: Insufficient documentation

## 2015-12-09 HISTORY — DX: Cardiac murmur, unspecified: R01.1

## 2015-12-09 LAB — BASIC METABOLIC PANEL
Anion gap: 9 (ref 5–15)
BUN: 5 mg/dL — AB (ref 6–20)
CHLORIDE: 102 mmol/L (ref 101–111)
CO2: 27 mmol/L (ref 22–32)
Calcium: 9.6 mg/dL (ref 8.9–10.3)
Creatinine, Ser: 0.5 mg/dL (ref 0.44–1.00)
GFR calc Af Amer: >60 mL/min (ref >60)
GFR calc non Af Amer: >60 mL/min (ref >60)
GLUCOSE: 139 mg/dL — AB (ref 65–99)
POTASSIUM: 3.7 mmol/L (ref 3.5–5.1)
Sodium: 138 mmol/L (ref 135–145)

## 2015-12-09 LAB — CBC WITH DIFFERENTIAL/PLATELET
Basophils Absolute: 0 10*3/uL (ref 0.0–0.1)
Basophils Relative: 0 %
EOS PCT: 1 %
Eosinophils Absolute: 0.1 10*3/uL (ref 0.0–0.7)
HEMATOCRIT: 40.9 % (ref 36.0–46.0)
Hemoglobin: 13.8 g/dL (ref 12.0–15.0)
LYMPHS ABS: 2.9 10*3/uL (ref 0.7–4.0)
LYMPHS PCT: 30 %
MCH: 30.6 pg (ref 26.0–34.0)
MCHC: 33.7 g/dL (ref 30.0–36.0)
MCV: 90.7 fL (ref 78.0–100.0)
MONO ABS: 0.4 10*3/uL (ref 0.1–1.0)
MONOS PCT: 4 %
Neutro Abs: 6.3 10*3/uL (ref 1.7–7.7)
Neutrophils Relative %: 65 %
PLATELETS: 431 10*3/uL — AB (ref 150–400)
RBC: 4.51 MIL/uL (ref 3.87–5.11)
RDW: 12.6 % (ref 11.5–15.5)
WBC: 9.8 10*3/uL (ref 4.0–10.5)

## 2015-12-09 LAB — GLUCOSE, CAPILLARY: Glucose-Capillary: 147 mg/dL — ABNORMAL HIGH (ref 65–99)

## 2015-12-09 LAB — HCG, SERUM, QUALITATIVE: Preg, Serum: NEGATIVE

## 2015-12-09 NOTE — Pre-Procedure Instructions (Signed)
Alison Rowe  12/09/2015      [facility] [id number] [place], [place] - [address] AT [facility] [address] [place] [id number] Phone: [phone] Fax: [phone]  [facility] [id number] [place], [place] - [address] AT [facility] OF [address] [place] [id number] Phone: [phone] Fax: [phone]    Your procedure is scheduled on Thursday December 11 2015.  [facility].  Call this number if you have problems the morning of surgery:  [phone]   Remember:  Do not eat food or drink liquids after midnight Wednesday March 29th.  Take these medicines the morning of surgery with A SIP OF WATER none  STOP: ALL Vitamins, Supplements, Effient and Herbal Medications, Fish Oils, Aspirins, NSAIDs (Nonsteroidal Anti-inflammatories such as Ibuprofen, Aleve, or Advil), and Goody's/BC Powders 7 days prior to surgery, until after surgery as directed by your physician.     How to Manage Your Diabetes Before and After Surgery  Why is it important to control my blood sugar before and after surgery? . Improving blood sugar levels before and after surgery helps healing and can limit problems. . A way of improving blood sugar control is eating a healthy diet by: o  Eating less sugar and carbohydrates o  Increasing activity/exercise o  Talking with your doctor about reaching your blood sugar goals . High blood sugars (greater than 180 mg/dL) can raise your risk of infections and slow your recovery, so you will need to focus on controlling your diabetes during the weeks before surgery. . Make sure that the doctor who takes care of your diabetes knows about your planned surgery including the date and location.  How do I manage my blood sugar before surgery? . Check your blood sugar at least 4 times a day, starting 2 days before surgery, to make sure  that the level is not too high or low. o Check your blood sugar the morning of your surgery when you wake up and every 2 hours until you get to the Short Stay unit. . If your blood sugar is less than 70 mg/dL, you will need to treat for low blood sugar: o Do not take insulin. o Treat a low blood sugar (less than 70 mg/dL) with  cup of clear juice (cranberry or apple), 4 glucose tablets, OR glucose gel. o Recheck blood sugar in 15 minutes after treatment (to make sure it is greater than 70 mg/dL). If your blood sugar is not greater than 70 mg/dL on recheck, call [phone] for further instructions. . Report your blood sugar to the short stay nurse when you get to Short Stay.  . If you are admitted to the hospital after surgery: o Your blood sugar will be checked by the staff and you will probably be given insulin after surgery (instead of oral diabetes medicines) to make sure you have good blood sugar levels. o The goal for blood sugar control after surgery is 80-180 mg/dL.      WHAT DO I DO ABOUT MY DIABETES MEDICATION?   Marland Kitchen. Do not take oral diabetes medicines (pills) the morning of surgery (glipizide and metformin).  . THE NIGHT BEFORE SURGERY, take no units of Novolin R Insulin. Do not take evening dose of glipizide the night before surgery.      Marland Kitchen. HE MORNING OF SURGERY, take  no units of Novolin R insulin.  . The day of surgery, do not take other diabetes injectables, including Byetta (exenatide), Bydureon (exenatide ER), Victoza (liraglutide), or Trulicity (dulaglutide).  . If your CBG is greater than 220 mg/dL, you may take  of your sliding scale (correction) dose of insulin (2 units)   Do not wear jewelry, make-up or nail polish.  Do not wear lotions, powders, or perfumes.  You may wear deodorant.  Do not shave 48 hours prior to surgery.  Men may shave face and neck.  Do not bring valuables to the hospital.  [facility] is not responsible for any belongings or  valuables.  Contacts, dentures or bridgework may not be worn into surgery.  Leave your suitcase in the car.  After surgery it may be brought to your room.  For patients admitted to the hospital, discharge time will be determined by your treatment team.  Patients discharged the day of surgery will not be allowed to drive home.        Preparing for Surgery at [facility]  Before surgery, you can play an important role.  Because skin is not sterile, your skin needs to be as free of germs as possible.  You can reduce the number of germs on your skin by washing with CHG (chlorahexidine gluconate) Soap before surgery.  CHG is an antiseptic cleaner with kills germs and bonds with the skin to continue killing germs even after washing.   Please do not use if you have an allergy to CHG or antibacterial soaps.  If your skin becomes reddened/irritated stop using the CHG.  Do not shave (including legs and underarms) for at least 48 hours prior to first CHG shower.  It is okay to shave your face.  Please follow these instructions carefully:  1. Shower with CHG Soap the night before surgery and the morning of Surgery. 2. If you choose to wash your hair, wash your hair first as usual with your normal shampoo. 3. After you shampoo, rinse your hair and body thoroughly to remove the Shampoo. 4. Use CHG as you would any other liquid soap. You can apply chg directly to the skin and wash gently with scrungie or a clean washcloth. 5. Apply the CHG Soap to your body ONLY FROM THE NECK DOWN. Do not use on open wounds or open sores. Avoid contact with your eyes, ears, mouth and genitals (private parts). Wash genitals (private parts) with your normal soap. 6. Wash thoroughly, paying special attention to the area where your surgery will be performed. 7. Thoroughly rinse your body with warm water from the neck  down. 8. DO NOT shower/wash with your normal soap after using and rinsing off the CHG Soap. 9. Pat yourself dry with a clean towel.  10. Wear clean pajamas.  11. Place clean sheets on your bed the night of your first shower and do not sleep with pets.  Day of Surgery  Do not apply any lotions/deodorants the morning of surgery. Please wear clean clothes to the hospital/surgery center.    Please read over the following fact sheets that you were given. Pain Booklet, Coughing and Deep Breathing and Surgical Site Infection Prevention

## 2015-12-09 NOTE — Progress Notes (Signed)
Patient has no orders for PAT appt.  Have spoken with Zella BallRobin at [facility] and she says she will let somebody know.

## 2015-12-10 ENCOUNTER — Encounter (HOSPITAL_COMMUNITY): Payer: Self-pay

## 2015-12-10 MED ORDER — CEFAZOLIN SODIUM-DEXTROSE 2-4 GM/100ML-% IV SOLN
2.0000 g | INTRAVENOUS | Status: AC
  Administered 2015-12-11: 2 g via INTRAVENOUS
  Filled 2015-12-10: qty 100

## 2015-12-11 ENCOUNTER — Ambulatory Visit (HOSPITAL_COMMUNITY): Payer: Medicaid Other | Admitting: Emergency Medicine

## 2015-12-11 ENCOUNTER — Ambulatory Visit (HOSPITAL_COMMUNITY): Payer: Medicaid Other | Admitting: Certified Registered Nurse Anesthetist

## 2015-12-11 ENCOUNTER — Observation Stay (HOSPITAL_COMMUNITY)
Admission: RE | Admit: 2015-12-11 | Discharge: 2015-12-12 | Disposition: A | Discharge status: Home / Self Care | Payer: Medicaid Other | Source: Ambulatory Visit | Attending: General Surgery | Admitting: General Surgery

## 2015-12-11 ENCOUNTER — Encounter (HOSPITAL_COMMUNITY): Payer: Self-pay | Admitting: Certified Registered Nurse Anesthetist

## 2015-12-11 ENCOUNTER — Encounter (HOSPITAL_COMMUNITY)
Admission: RE | Disposition: A | Discharge status: Home / Self Care | Payer: Self-pay | Source: Ambulatory Visit | Attending: General Surgery

## 2015-12-11 DIAGNOSIS — E119 Type 2 diabetes mellitus without complications: Secondary | ICD-10-CM | POA: Diagnosis not present

## 2015-12-11 DIAGNOSIS — Z794 Long term (current) use of insulin: Secondary | ICD-10-CM | POA: Insufficient documentation

## 2015-12-11 DIAGNOSIS — F329 Major depressive disorder, single episode, unspecified: Secondary | ICD-10-CM | POA: Insufficient documentation

## 2015-12-11 DIAGNOSIS — Z7984 Long term (current) use of oral hypoglycemic drugs: Secondary | ICD-10-CM | POA: Diagnosis not present

## 2015-12-11 DIAGNOSIS — F419 Anxiety disorder, unspecified: Secondary | ICD-10-CM | POA: Diagnosis not present

## 2015-12-11 DIAGNOSIS — R011 Cardiac murmur, unspecified: Secondary | ICD-10-CM | POA: Diagnosis not present

## 2015-12-11 DIAGNOSIS — N611 Abscess of the breast and nipple: Secondary | ICD-10-CM | POA: Diagnosis not present

## 2015-12-11 DIAGNOSIS — Z87891 Personal history of nicotine dependence: Secondary | ICD-10-CM | POA: Diagnosis not present

## 2015-12-11 HISTORY — PX: BREAST BIOPSY: SHX20

## 2015-12-11 HISTORY — DX: Type 2 diabetes mellitus without complications: E11.9

## 2015-12-11 HISTORY — DX: Other mental disorders complicating the puerperium: O99.345

## 2015-12-11 HISTORY — PX: INCISION AND DRAINAGE ABSCESS: SHX5864

## 2015-12-11 HISTORY — DX: Postpartum depression: F53.0

## 2015-12-11 LAB — GLUCOSE, CAPILLARY
GLUCOSE-CAPILLARY: 125 mg/dL — AB (ref 65–99)
GLUCOSE-CAPILLARY: 135 mg/dL — AB (ref 65–99)
Glucose-Capillary: 218 mg/dL — ABNORMAL HIGH (ref 65–99)

## 2015-12-11 SURGERY — INCISION AND DRAINAGE, ABSCESS
Anesthesia: General | Site: Breast | Laterality: Right

## 2015-12-11 MED ORDER — LACTATED RINGERS IV SOLN
INTRAVENOUS | Status: DC
  Administered 2015-12-11: 13:00:00 via INTRAVENOUS

## 2015-12-11 MED ORDER — FENTANYL CITRATE (PF) 100 MCG/2ML IJ SOLN
25.0000 ug | INTRAMUSCULAR | Status: DC | PRN
  Administered 2015-12-11 (×2): 50 ug via INTRAVENOUS

## 2015-12-11 MED ORDER — 0.9 % SODIUM CHLORIDE (POUR BTL) OPTIME
TOPICAL | Status: DC | PRN
  Administered 2015-12-11: 1000 mL

## 2015-12-11 MED ORDER — MIDAZOLAM HCL 5 MG/5ML IJ SOLN
INTRAMUSCULAR | Status: DC | PRN
  Administered 2015-12-11: 2 mg via INTRAVENOUS

## 2015-12-11 MED ORDER — ONDANSETRON HCL 4 MG/2ML IJ SOLN
INTRAMUSCULAR | Status: DC | PRN
  Administered 2015-12-11: 4 mg via INTRAVENOUS

## 2015-12-11 MED ORDER — GLIPIZIDE 5 MG PO TABS
5.0000 mg | ORAL_TABLET | Freq: Two times a day (BID) | ORAL | Status: DC
  Administered 2015-12-11 – 2015-12-12 (×2): 5 mg via ORAL
  Filled 2015-12-11 (×3): qty 1

## 2015-12-11 MED ORDER — OXYCODONE HCL 5 MG PO TABS: 5.0000 mg | ORAL_TABLET | Freq: Once | ORAL | Status: AC | PRN

## 2015-12-11 MED ORDER — DEXAMETHASONE SODIUM PHOSPHATE 4 MG/ML IJ SOLN
INTRAMUSCULAR | Status: DC | PRN
  Administered 2015-12-11: 4 mg via INTRAVENOUS

## 2015-12-11 MED ORDER — PROPOFOL 10 MG/ML IV BOLUS
INTRAVENOUS | Status: DC | PRN
  Administered 2015-12-11: 200 mg via INTRAVENOUS

## 2015-12-11 MED ORDER — MORPHINE SULFATE (PF) 2 MG/ML IV SOLN
2.0000 mg | INTRAVENOUS | Status: DC | PRN
  Administered 2015-12-12: 2 mg via INTRAVENOUS
  Filled 2015-12-11: qty 1

## 2015-12-11 MED ORDER — ACETAMINOPHEN 160 MG/5ML PO SOLN
325.0000 mg | ORAL | Status: DC | PRN
  Filled 2015-12-11: qty 20.3

## 2015-12-11 MED ORDER — MIDAZOLAM HCL 2 MG/2ML IJ SOLN
INTRAMUSCULAR | Status: AC
  Filled 2015-12-11: qty 2

## 2015-12-11 MED ORDER — ONDANSETRON HCL 4 MG/2ML IJ SOLN: 4.0000 mg | Freq: Four times a day (QID) | INTRAMUSCULAR | Status: DC | PRN

## 2015-12-11 MED ORDER — OXYCODONE HCL 5 MG/5ML PO SOLN
5.0000 mg | Freq: Once | ORAL | Status: AC | PRN
  Administered 2015-12-11: 5 mg via ORAL

## 2015-12-11 MED ORDER — PROPOFOL 10 MG/ML IV BOLUS
INTRAVENOUS | Status: AC
  Filled 2015-12-11: qty 20

## 2015-12-11 MED ORDER — ACETAMINOPHEN 325 MG PO TABS
325.0000 mg | ORAL_TABLET | ORAL | Status: DC | PRN
Start: 2015-12-11 — End: 2015-12-11

## 2015-12-11 MED ORDER — BUPIVACAINE HCL 0.25 % IJ SOLN
INTRAMUSCULAR | Status: DC | PRN
  Administered 2015-12-11: 10 mL

## 2015-12-11 MED ORDER — OXYCODONE HCL 5 MG/5ML PO SOLN
ORAL | Status: AC
  Filled 2015-12-11: qty 5

## 2015-12-11 MED ORDER — LIDOCAINE HCL (CARDIAC) 20 MG/ML IV SOLN
INTRAVENOUS | Status: DC | PRN
  Administered 2015-12-11: 100 mg via INTRAVENOUS

## 2015-12-11 MED ORDER — SODIUM CHLORIDE 0.9 % IV SOLN
INTRAVENOUS | Status: DC
  Administered 2015-12-11: 19:00:00 via INTRAVENOUS

## 2015-12-11 MED ORDER — FENTANYL CITRATE (PF) 100 MCG/2ML IJ SOLN
INTRAMUSCULAR | Status: AC
  Filled 2015-12-11: qty 2

## 2015-12-11 MED ORDER — ONDANSETRON 4 MG PO TBDP: 4.0000 mg | ORAL_TABLET | Freq: Four times a day (QID) | ORAL | Status: DC | PRN

## 2015-12-11 MED ORDER — ONDANSETRON HCL 4 MG/2ML IJ SOLN
INTRAMUSCULAR | Status: AC
  Filled 2015-12-11: qty 2

## 2015-12-11 MED ORDER — SULFAMETHOXAZOLE-TRIMETHOPRIM 800-160 MG PO TABS
1.0000 | ORAL_TABLET | Freq: Two times a day (BID) | ORAL | Status: DC
  Administered 2015-12-11 – 2015-12-12 (×2): 1 via ORAL
  Filled 2015-12-11 (×2): qty 1

## 2015-12-11 MED ORDER — FENTANYL CITRATE (PF) 100 MCG/2ML IJ SOLN
INTRAMUSCULAR | Status: DC | PRN
  Administered 2015-12-11: 150 ug via INTRAVENOUS
  Administered 2015-12-11: 100 ug via INTRAVENOUS

## 2015-12-11 MED ORDER — DEXAMETHASONE SODIUM PHOSPHATE 4 MG/ML IJ SOLN
INTRAMUSCULAR | Status: AC
  Filled 2015-12-11: qty 1

## 2015-12-11 MED ORDER — FENTANYL CITRATE (PF) 250 MCG/5ML IJ SOLN
INTRAMUSCULAR | Status: AC
  Filled 2015-12-11: qty 5

## 2015-12-11 MED ORDER — INSULIN ASPART 100 UNIT/ML ~~LOC~~ SOLN
0.0000 [IU] | Freq: Three times a day (TID) | SUBCUTANEOUS | Status: DC
  Administered 2015-12-12 (×2): 2 [IU] via SUBCUTANEOUS

## 2015-12-11 MED ORDER — OXYCODONE HCL 5 MG PO TABS
5.0000 mg | ORAL_TABLET | ORAL | Status: DC | PRN
  Administered 2015-12-11: 10 mg via ORAL
  Filled 2015-12-11: qty 2

## 2015-12-11 MED ORDER — BUPIVACAINE HCL (PF) 0.25 % IJ SOLN
INTRAMUSCULAR | Status: AC
  Filled 2015-12-11: qty 30

## 2015-12-11 MED ORDER — ACETAMINOPHEN 500 MG PO TABS
1000.0000 mg | ORAL_TABLET | Freq: Four times a day (QID) | ORAL | Status: DC
  Administered 2015-12-11 – 2015-12-12 (×3): 1000 mg via ORAL
  Filled 2015-12-11 (×3): qty 2

## 2015-12-11 MED ORDER — METHOCARBAMOL 500 MG PO TABS
500.0000 mg | ORAL_TABLET | Freq: Four times a day (QID) | ORAL | Status: DC | PRN
  Administered 2015-12-11: 500 mg via ORAL
  Filled 2015-12-11: qty 1

## 2015-12-11 SURGICAL SUPPLY — 33 items
BNDG GAUZE ELAST 4 BULKY (GAUZE/BANDAGES/DRESSINGS) IMPLANT
CANISTER SUCTION 2500CC (MISCELLANEOUS) ×3 IMPLANT
COVER SURGICAL LIGHT HANDLE (MISCELLANEOUS) ×3 IMPLANT
DRAPE LAPAROSCOPIC ABDOMINAL (DRAPES) ×3 IMPLANT
DRAPE LAPAROTOMY T 98X78 PEDS (DRAPES) IMPLANT
DRAPE UTILITY XL STRL (DRAPES) ×3 IMPLANT
DRSG PAD ABDOMINAL 8X10 ST (GAUZE/BANDAGES/DRESSINGS) IMPLANT
ELECT CAUTERY BLADE 6.4 (BLADE) ×3 IMPLANT
ELECT REM PT RETURN 9FT ADLT (ELECTROSURGICAL) ×3
ELECTRODE REM PT RTRN 9FT ADLT (ELECTROSURGICAL) ×1 IMPLANT
GAUZE IODOFORM PACK 1/2 7832 (GAUZE/BANDAGES/DRESSINGS) ×3 IMPLANT
GAUZE SPONGE 4X4 12PLY STRL (GAUZE/BANDAGES/DRESSINGS) ×3 IMPLANT
GLOVE BIO SURGEON STRL SZ7 (GLOVE) ×6 IMPLANT
GLOVE BIOGEL PI IND STRL 7.0 (GLOVE) ×1 IMPLANT
GLOVE BIOGEL PI IND STRL 7.5 (GLOVE) ×1 IMPLANT
GLOVE BIOGEL PI INDICATOR 7.0 (GLOVE) ×2
GLOVE BIOGEL PI INDICATOR 7.5 (GLOVE) ×2
GLOVE SURG SS PI 7.0 STRL IVOR (GLOVE) ×6 IMPLANT
GOWN STRL REUS W/ TWL LRG LVL3 (GOWN DISPOSABLE) ×2 IMPLANT
GOWN STRL REUS W/TWL LRG LVL3 (GOWN DISPOSABLE) ×4
KIT BASIN OR (CUSTOM PROCEDURE TRAY) ×3 IMPLANT
KIT ROOM TURNOVER OR (KITS) ×3 IMPLANT
NEEDLE HYPO 25GX1X1/2 BEV (NEEDLE) ×3 IMPLANT
NS IRRIG 1000ML POUR BTL (IV SOLUTION) ×3 IMPLANT
PACK GENERAL/GYN (CUSTOM PROCEDURE TRAY) ×3 IMPLANT
PAD ABD 8X10 STRL (GAUZE/BANDAGES/DRESSINGS) ×6 IMPLANT
PAD ARMBOARD 7.5X6 YLW CONV (MISCELLANEOUS) ×3 IMPLANT
SUT VIC AB 3-0 SH 8-18 (SUTURE) ×3 IMPLANT
SWAB COLLECTION DEVICE MRSA (MISCELLANEOUS) ×3 IMPLANT
SYR CONTROL 10ML LL (SYRINGE) ×3 IMPLANT
TOWEL OR 17X24 6PK STRL BLUE (TOWEL DISPOSABLE) IMPLANT
TOWEL OR 17X26 10 PK STRL BLUE (TOWEL DISPOSABLE) ×3 IMPLANT
TUBE ANAEROBIC SPECIMEN COL (MISCELLANEOUS) ×3 IMPLANT

## 2015-12-11 NOTE — Anesthesia Preprocedure Evaluation (Signed)
Anesthesia Evaluation  Patient identified by MRN, date of birth, ID band Patient awake    Reviewed: Allergy & Precautions, H&P , Patient's Chart, lab work & pertinent test results, reviewed documented beta blocker date and time   Airway Mallampati: II  TM Distance: >3 FB Neck ROM: full    Dental no notable dental hx.    Pulmonary former smoker,    Pulmonary exam normal breath sounds clear to auscultation       Cardiovascular  Rhythm:regular Rate:Normal     Neuro/Psych    GI/Hepatic   Endo/Other  diabetes  Renal/GU      Musculoskeletal   Abdominal   Peds  Hematology   Anesthesia Other Findings   Reproductive/Obstetrics                             Anesthesia Physical Anesthesia Plan  ASA: II  Anesthesia Plan:    Post-op Pain Management:    Induction: Intravenous  Airway Management Planned: LMA  Additional Equipment:   Intra-op Plan:   Post-operative Plan:   Informed Consent: I have reviewed the patients History and Physical, chart, labs and discussed the procedure including the risks, benefits and alternatives for the proposed anesthesia with the patient or authorized representative who has indicated his/her understanding and acceptance.   Dental Advisory Given and Dental advisory given  Plan Discussed with: CRNA and Surgeon  Anesthesia Plan Comments: (Discussed GA with LMA, possible sore throat, potential need to switch to ETT, N/V, pulmonary aspiration. Questions answered. )        Anesthesia Quick Evaluation

## 2015-12-11 NOTE — Interval H&P Note (Signed)
History and Physical Interval Note:  12/11/2015 1:47 PM  Alison Rowe  has presented today for surgery, with the diagnosis of RIGHT BREAST ABSCESS  The various methods of treatment have been discussed with the patient and family. After consideration of risks, benefits and other options for treatment, the patient has consented to  Procedure(s): INCISION AND DRAINAGE ABSCESS RIGHT BREAST ABSCESS (Right) as a surgical intervention .  The patient's history has been reviewed, patient examined, no change in status, stable for surgery.  I have reviewed the patient's chart and labs.  Questions were answered to the patient's satisfaction.     Bobetta Korf

## 2015-12-11 NOTE — Transfer of Care (Signed)
Immediate Anesthesia Transfer of Care Note  Patient: Alison Rowe  Procedure(s) Performed: Procedure(s): INCISION AND DRAINAGE RIGHT BREAST ABSCESS (Right)  Patient Location: PACU  Anesthesia Type:General  Level of Consciousness: awake, oriented and patient cooperative  Airway & Oxygen Therapy: Patient Spontanous Breathing and Patient connected to nasal cannula oxygen  Post-op Assessment: Report given to RN and Post -op Vital signs reviewed and stable  Post vital signs: Reviewed  Last Vitals:  Filed Vitals:   12/11/15 1242  BP: 105/64  Pulse: 111  Temp: 36.8 C  Resp: 20    Complications: No apparent anesthesia complications

## 2015-12-11 NOTE — Anesthesia Procedure Notes (Signed)
Procedure Name: LMA Insertion Date/Time: 12/11/2015 2:38 PM Performed by: Romie MinusOCK, Derrico Zhong K Pre-anesthesia Checklist: Patient identified, Emergency Drugs available, Suction available, Patient being monitored and Timeout performed Patient Re-evaluated:Patient Re-evaluated prior to inductionOxygen Delivery Method: Circle system utilized Preoxygenation: Pre-oxygenation with 100% oxygen Intubation Type: IV induction Ventilation: Mask ventilation without difficulty LMA: LMA inserted LMA Size: 4.0 Number of attempts: 1 Placement Confirmation: positive ETCO2,  CO2 detector and breath sounds checked- equal and bilateral Tube secured with: Tape Dental Injury: Teeth and Oropharynx as per pre-operative assessment

## 2015-12-11 NOTE — Op Note (Signed)
Preoperative diagnosis: Right breast chronic abscess  Postoperative diagnosis: same as above Procedure: Right breast chronic abscess incision and drainage, incisional biopsy Surgeon: Dr Harden MoMatt Shary Lamos Anesthesia: general EBL: 50 cc Drains none Complications none Specimen lright breast tissue Sponge and needle count was correct to completion disposition to recovery stable  Indications: This is a [age] yof with chronic right breast abscess.  She had abscess treated with antibiotics previously and was unable to have this drained.  She had a medial right breast abscess that was drained by one of my partners.  She then had another area develop laterally. US showed a multiloculated breast abscess. We discussed incision and drainage.   Procedure: After informed consent was obtained the patient was taken to the operating room. She was given antibiotics. Sequential compression devices were on her legs. She was then placed under general anesthesia without complication. She was prepped and draped in the standard sterile surgical fashion. A surgical timeout was then performed.   I infiltrated 1% lidocaine in the lateral periareolar area.  I then made an incision and entered into an abscess cavity.  I took cultures.  I then noted a multiloculated abscess with purulence and a lot of thickened tissue. I did remove several areas of tissue and sent for pathology.  This actually connected to the medial incision as well. I packed this with a bottle of iodoform.  Dressings were placed.  A binder was placed. She tolerated this well was extubated and transferred to recovery in stable condition

## 2015-12-11 NOTE — H&P (Signed)
Alison Rowe is an 30 y.o. female.   Chief Complaint: right breast mass, recent abscess i and d HPI: 40 yof with healing right breast abscess that underwent incision and drainage. she returned for postop follow up and had another area in the lateral breast that is hard. this underwent US and appears to be another complex multiloculated breast abscess     Past Medical History  Diagnosis Date  . Depression   . Gestational diabetes   . Heart murmur   . Anxiety   . DM type 2 (diabetes mellitus, type 2) ([facility])     Past Surgical History  Procedure Laterality Date  . Cesarean section    . Gallbladder surgery    . Evacuation breast hematoma Right 10/26/2015    Procedure: IRRIGATION AND DEBRIDEMENT BREAST ;  Surgeon: Ralene Ok, MD;  Location: [facility];  Service: General;  Laterality: Right;    Family History  Problem Relation Age of Onset  . Diabetes Mother   . Diabetes Father   . Hyperlipidemia Father   . Hypertension Father   . Diabetes Maternal Grandmother    Social History:  reports that she quit smoking about 6 years ago. Her smoking use included Cigarettes. She has a .25 pack-year smoking history. She does not have any smokeless tobacco history on file. She reports that she does not drink alcohol or use illicit drugs.  Allergies: No Known Allergies  meds none  Results for orders placed or performed during the hospital encounter of 12/09/15 (from the past 48 hour(s))  Glucose, capillary     Status: Abnormal   Collection Time: 12/09/15  3:12 PM  Result Value Ref Range   Glucose-Capillary 147 (H) 65 - 99 mg/dL  Basic metabolic panel     Status: Abnormal   Collection Time: 12/09/15  3:29 PM  Result Value Ref Range   Sodium 138 135 - 145 mmol/L   Potassium 3.7 3.5 - 5.1 mmol/L   Chloride 102 101 - 111 mmol/L   CO2 27 22 - 32 mmol/L   Glucose, Bld 139 (H) 65 - 99 mg/dL   BUN 5 (L) 6 - 20 mg/dL   Creatinine, Ser 0.50 0.44 - 1.00 mg/dL   Calcium 9.6 8.9 - 10.3 mg/dL   GFR  calc non Af Amer >60 >60 mL/min   GFR calc Af Amer >60 >60 mL/min    Comment: (NOTE) The eGFR has been calculated using the CKD EPI equation. This calculation has not been validated in all clinical situations. eGFR's persistently <60 mL/min signify possible Chronic Kidney Disease.    Anion gap 9 5 - 15  CBC WITH DIFFERENTIAL     Status: Abnormal   Collection Time: 12/09/15  3:29 PM  Result Value Ref Range   WBC 9.8 4.0 - 10.5 K/uL   RBC 4.51 3.87 - 5.11 MIL/uL   Hemoglobin 13.8 12.0 - 15.0 g/dL   HCT 40.9 36.0 - 46.0 %   MCV 90.7 78.0 - 100.0 fL   MCH 30.6 26.0 - 34.0 pg   MCHC 33.7 30.0 - 36.0 g/dL   RDW 12.6 11.5 - 15.5 %   Platelets 431 (H) 150 - 400 K/uL   Neutrophils Relative % 65 %   Neutro Abs 6.3 1.7 - 7.7 K/uL   Lymphocytes Relative 30 %   Lymphs Abs 2.9 0.7 - 4.0 K/uL   Monocytes Relative 4 %   Monocytes Absolute 0.4 0.1 - 1.0 K/uL   Eosinophils Relative 1 %   Eosinophils  Absolute 0.1 0.0 - 0.7 K/uL   Basophils Relative 0 %   Basophils Absolute 0.0 0.0 - 0.1 K/uL  hCG, serum, qualitative     Status: None   Collection Time: 12/09/15  3:41 PM  Result Value Ref Range   Preg, Serum NEGATIVE NEGATIVE    Comment:        THE SENSITIVITY OF THIS METHODOLOGY IS >10 mIU/mL.    No results found.  ROS Negative Physical Exam   Vitals (Sonya Bynum CMA; 12/05/2015 11:51 AM) 12/05/2015 11:50 AM Weight: 203 lb Height: 68in Body Surface Area: 2.06 m Body Mass Index: 30.87 kg/m  Temp.: 98.30F(Temporal)  Pulse: 81 (Regular)  BP: 122/78 (Sitting, Left Arm, Standard) Physical Exam Rolm Bookbinder MD; 12/05/2015 1:59 PM) Breast Note: healing medial wound with 5 cm hard area c/w chronic abscess laterally near nac cv rrr pulm clear Assessment/Plan  BREAST ABSCESS OF FEMALE (N61.1) Story: Incision and drainage, biopsy will schedule for i and d and likely biopsy next week for this chronic abscess. I think she has failed all conservative therapy at this  point   [facility], MD 12/11/2015, 11:16 AM

## 2015-12-12 ENCOUNTER — Encounter (HOSPITAL_COMMUNITY): Payer: Self-pay | Admitting: General Surgery

## 2015-12-12 DIAGNOSIS — N611 Abscess of the breast and nipple: Secondary | ICD-10-CM | POA: Diagnosis not present

## 2015-12-12 LAB — GLUCOSE, CAPILLARY
GLUCOSE-CAPILLARY: 145 mg/dL — AB (ref 65–99)
Glucose-Capillary: 144 mg/dL — ABNORMAL HIGH (ref 65–99)
Glucose-Capillary: 147 mg/dL — ABNORMAL HIGH (ref 65–99)

## 2015-12-12 MED ORDER — OXYCODONE HCL 5 MG PO TABS: 5.0000 mg | ORAL_TABLET | ORAL | Status: DC | PRN

## 2015-12-12 MED ORDER — SULFAMETHOXAZOLE-TRIMETHOPRIM 800-160 MG PO TABS: 1.0000 | ORAL_TABLET | Freq: Two times a day (BID) | ORAL | Status: DC

## 2015-12-12 NOTE — Progress Notes (Signed)
1 Day Post-Op  Subjective: Doing fine this am  Objective: Vital signs in last 24 hours: Temp:  [98 F (36.7 C)-98.6 F (37 C)] 98 F (36.7 C) (03/31 0559) Pulse Rate:  [87-111] 87 (03/31 0559) Resp:  [12-20] 18 (03/31 0559) BP: (103-151)/(52-87) 103/52 mmHg (03/31 0559) SpO2:  [95 %-99 %] 98 % (03/31 0559) Weight:  [92.08 kg (203 lb)] 92.08 kg (203 lb) (03/30 1242) Last BM Date: 12/10/15  Intake/Output from previous day: 03/30 0701 - 03/31 0700 In: 1139.3 [P.O.:480; I.V.:559.3; IV Piggyback:100] Out: 5 [Blood:5] Intake/Output this shift: Total I/O In: 480 [P.O.:480] Out: -   Dressing in place right breast  Lab Results:   Recent Labs  12/09/15 1529  WBC 9.8  HGB 13.8  HCT 40.9  PLT 431*   BMET  Recent Labs  12/09/15 1529  NA 138  K 3.7  CL 102  CO2 27  GLUCOSE 139*  BUN 5*  CREATININE 0.50  CALCIUM 9.6   PT/INR No results for input(s): LABPROT, INR in the last 72 hours. ABG No results for input(s): PHART, HCO3 in the last 72 hours.  Invalid input(s): PCO2, PO2  Studies/Results: No results found.  Anti-infectives: Anti-infectives    Start     Dose/Rate Route Frequency Ordered Stop   12/12/15 0000  sulfamethoxazole-trimethoprim (BACTRIM DS,SEPTRA DS) 800-160 MG tablet     1 tablet Oral Every 12 hours 12/12/15 0623     12/11/15 2200  sulfamethoxazole-trimethoprim (BACTRIM DS,SEPTRA DS) 800-160 MG per tablet 1 tablet     1 tablet Oral Every 12 hours 12/11/15 1640     12/11/15 1400  ceFAZolin (ANCEF) IVPB 2g/100 mL premix     2 g 200 mL/hr over 30 Minutes [facility]ous [facility] 12/10/15 1052 12/11/15 1449      Assessment/Plan: Drainage/biopsy chronic right breast abscess  Will change dressing this am and hopefully home   [facility],Jeremia Groot 12/12/2015

## 2015-12-12 NOTE — Progress Notes (Signed)
Pt discharged home this pm in stable condition. Discharge education provided prior to discharge with no concerns voiced .

## 2015-12-12 NOTE — Discharge Instructions (Signed)
Dressing Change A dressing is a material placed over wounds. It keeps the wound clean, dry, and protected from further injury. This provides an environment that favors wound healing.  BEFORE YOU BEGIN  Get your supplies together. Things you may need include:  Saline solution.  Flexible gauze dressing.  Medicated cream.  Tape.  Gloves.  Abdominal dressing pads.  Gauze squares.  Plastic bags.  Take pain medicine 30 minutes before the dressing change if you need it.  Take a shower before you do the first dressing change of the day. Use plastic wrap or a plastic bag to prevent the dressing from getting wet. REMOVING YOUR OLD DRESSING   Wash your hands with soap and water. Dry your hands with a clean towel.  Put on your gloves.  Remove any tape.  Carefully remove the old dressing. If the dressing sticks, you may dampen it with warm water to loosen it, or follow your caregiver's specific directions.  Remove any gauze or packing tape that is in your wound.  Take off your gloves.  Put the gloves, tape, gauze, or any packing tape into a plastic bag. CHANGING YOUR DRESSING  Open the supplies.  Take the cap off the saline solution.  Open the gauze package so that the gauze remains on the inside of the package.  Put on your gloves.  Clean your wound as told by your caregiver.  If you have been told to keep your wound dry, follow those instructions.  Your caregiver may tell you to do one or more of the following:  Pick up the gauze. Pour the saline solution over the gauze. Squeeze out the extra saline solution.  Put medicated cream or other medicine on your wound if you have been told to do so.  Put the solution soaked gauze only in your wound, not on the skin around it.  Pack your wound loosely or as told by your caregiver.  Put dry gauze on your wound.  Put abdominal dressing pads over the dry gauze if your wet gauze soaks through.  Tape the abdominal dressing  pads in place so they will not fall off. Do not wrap the tape completely around the affected part (arm, leg, abdomen).  Wrap the dressing pads with a flexible gauze dressing to secure it in place.  Take off your gloves. Put them in the plastic bag with the old dressing. Tie the bag shut and throw it away.  Keep the dressing clean and dry until your next dressing change.  Wash your hands. SEEK MEDICAL CARE IF:  Your skin around the wound looks red.  Your wound feels more tender or sore.  You see pus in the wound.  Your wound smells bad.  You have a fever.  Your skin around the wound has a rash that itches and burns.  You see black or yellow skin in your wound that was not there before.  You feel nauseous, throw up, and feel very tired.   This information is not intended to replace advice given to you by your health care provider. Make sure you discuss any questions you have with your health care provider.   Document Released: 10/07/2004 Document Revised: 11/22/2011 Document Reviewed: 07/12/2011 Elsevier Interactive Patient Education 2016 Elsevier Inc[employer]  Pack the wound loosely with gauze and cover as it will drain.  Wear good fitting bra to give some compression.

## 2015-12-14 LAB — CULTURE, ROUTINE-ABSCESS: Culture: NO GROWTH

## 2015-12-16 LAB — ANAEROBIC CULTURE

## 2015-12-16 NOTE — Discharge Summary (Signed)
Physician Discharge Summary  Patient ID: Alison Rowe MRN: [id number] DOB/AGE: 1986-06-04 30 y.o.  Admit date: 12/11/2015 Discharge date: 12/16/2015  Admission Diagnoses: Breast abscess  Discharge Diagnoses:  Active Problems:   Breast abscess of female   Discharged Condition: good Hospital Course: [age] yof who underwent right breast abscess incision and drainage.  She did well and was monitored overnight  Consults: none  Significant Diagnostic Studies: none  Treatments: surgery    Disposition: 01-Home or Self Care     Medication List    TAKE these medications        glipiZIDE 10 MG tablet  Commonly known as:  GLUCOTROL  Take 5 mg by mouth 2 (two) times daily before a meal.     ibuprofen 200 MG tablet  Commonly known as:  ADVIL,MOTRIN  Take 400 mg by mouth daily as needed for moderate pain.     levonorgestrel 20 MCG/24HR IUD  Commonly known as:  MIRENA  1 each by Intrauterine route once.     metFORMIN 1000 MG tablet  Commonly known as:  GLUCOPHAGE  Take 1,000 mg by mouth 2 (two) times daily.     NOVOLIN R 100 units/mL injection  Generic drug:  insulin regular  Inject 4 Units into the skin 3 (three) times daily before meals. >150     oxyCODONE 5 MG immediate release tablet  Commonly known as:  Oxy IR/ROXICODONE  Take 1-2 tablets (5-10 mg total) by mouth every 4 (four) hours as needed for moderate pain.     sulfamethoxazole-trimethoprim 800-160 MG tablet  Commonly known as:  BACTRIM DS,SEPTRA DS  Take 1 tablet by mouth every 12 (twelve) hours.           Follow-up Information    [facility]ram[facility] In 2 weeks.   Specialty:  General Surgery   Contact information:   [address] [place] [address] [phone]       Signed: Emelia LoronWAKEFIELD,Alison Pagan 12/16/2015, 2:40 PM

## 2015-12-24 NOTE — Anesthesia Postprocedure Evaluation (Signed)
Anesthesia Post Note  Patient: Alison Rowe  Procedure(s) Performed: Procedure(s) (LRB): INCISION AND DRAINAGE RIGHT BREAST ABSCESS (Right)  Patient location during evaluation: PACU Anesthesia Type: General Level of consciousness: sedated Pain management: satisfactory to patient Vital Signs Assessment: post-procedure vital signs reviewed and stable Respiratory status: spontaneous breathing Cardiovascular status: stable Anesthetic complications: no    Last Vitals:  Filed Vitals:   12/12/15 0559 12/12/15 1015  BP: 103/52 111/60  Pulse: 87 72  Temp: 36.7 C 36.7 C  Resp: 18 18    Last Pain:  Filed Vitals:   12/12/15 1129  PainSc: 3                  Annalis Kaczmarczyk EDWARD

## 2016-05-07 ENCOUNTER — Encounter: Payer: Self-pay | Admitting: Gastroenterology

## 2016-07-27 ENCOUNTER — Ambulatory Visit (INDEPENDENT_AMBULATORY_CARE_PROVIDER_SITE_OTHER): Payer: Medicaid Other | Admitting: Gastroenterology

## 2016-07-27 ENCOUNTER — Encounter: Payer: Self-pay | Admitting: Gastroenterology

## 2016-07-27 VITALS — BP 92/64 | HR 96 | Ht 68.5 in | Wt 210.2 lb

## 2016-07-27 DIAGNOSIS — R112 Nausea with vomiting, unspecified: Secondary | ICD-10-CM

## 2016-07-27 DIAGNOSIS — R197 Diarrhea, unspecified: Secondary | ICD-10-CM | POA: Diagnosis not present

## 2016-07-27 MED ORDER — CHOLESTYRAMINE 4 GM/DOSE PO POWD: 4.0000 g | Freq: Two times a day (BID) | ORAL | 3 refills | Status: DC

## 2016-07-27 NOTE — Patient Instructions (Addendum)
Trial of cholestyramine powder.  1 dose (4 gram, twice daily).  Disp 1 month with 3 refills. Call in 4-6 weeks to report on your response.

## 2016-07-27 NOTE — Progress Notes (Signed)
HPI: This is a   very pleasant 30 year old woman  who was referred to me by [facility]  to evaluate  chronic loose stools, intermittent vomiting  She is here with her 28-year-old daughter today. .    GB removed 11/2012, pain [place]stones[place]  She says she recovered from this without difficulty.  Immediately after the GB resection, noticed loose stools.  This has never improved  Every 1-2 months,vomiting episodes and worse than usual diarrhea.  These episodes are associated with epigastric abdominal pain. Can last 1-2 days.  3 bms daily, loose, never bloody.  Before GB once daily solid, nonbloody.  WEight is up/down.  No IBD or colon ancer in family.  Not tried imodium  Chief complaint is  chronic loose stools  Review of systems: Pertinent positive and negative review of systems were noted in the above HPI section. Complete review of systems was performed and was otherwise normal.   Past Medical History:  Diagnosis Date  . Gestational diabetes 2013  . Heart murmur   . Post partum depression 2013  . Type II diabetes mellitus ([facility])     Past Surgical History:  Procedure Laterality Date  . BREAST BIOPSY Right 12/11/2015   chronic abscess incision and drainage, incisional biopsy  . CESAREAN SECTION  2013  . EVACUATION BREAST HEMATOMA Right 10/26/2015   Procedure: IRRIGATION AND DEBRIDEMENT BREAST ;  Surgeon: Axel FillerArmando Ramirez, MD;  Location: [facility];  Service: General;  Laterality: Right;  . INCISION AND DRAINAGE ABSCESS Right 12/11/2015   Procedure: INCISION AND DRAINAGE RIGHT BREAST ABSCESS;  Surgeon: Emelia LoronMatthew Wakefield, MD;  Location: [facility];  Service: General;  Laterality: Right;  . LAPAROSCOPIC CHOLECYSTECTOMY  11/2013    Current Outpatient Prescriptions  Medication Sig Dispense Refill  . ASPIRIN LOW DOSE 81 MG chewable tablet daily.  11  . atorvastatin (LIPITOR) 80 MG tablet daily.  3  . glipiZIDE (GLUCOTROL) 10 MG tablet Take 5 mg by mouth 2 (two)  times daily before a meal.   3  . lisinopril (PRINIVIL,ZESTRIL) 2.5 MG tablet daily.  3  . metFORMIN (GLUCOPHAGE) 1000 MG tablet Take 1,000 mg by mouth 2 (two) times daily.  3  . NOVOLIN 70/30 (70-30) 100 UNIT/ML injection as directed.  3   No current facility-administered medications for this visit.     Allergies as of 07/27/2016  . (No Known Allergies)    Family History  Problem Relation Age of Onset  . Diabetes Mother   . Diabetes Father   . Hyperlipidemia Father   . Hypertension Father   . Diabetes Maternal Grandmother   . Colon cancer Neg Hx   . Stomach cancer Neg Hx   . Esophageal cancer Neg Hx   . Rectal cancer Neg Hx   . Liver cancer Neg Hx     Social History   Social History  . Marital status: Single    Spouse name: N/A  . Number of children: 1  . Years of education: N/A   Occupational History  . homemaker    Social History Main Topics  . Smoking status: Former Smoker    Packs/day: 0.12    Years: 1.00    Types: Cigarettes    Quit date: 12/08/2009  . Smokeless tobacco: Never Used  . Alcohol use 2.4 oz/week    4 Shots of liquor per week  . Drug use: No  . Sexual activity: Yes    Birth control/ protection: None   Other Topics  Concern  . Not on file   Social History Narrative  . No narrative on file     Physical Exam: BP 92/64   Pulse 96   Ht 5' 8.5" (1.74 m)   Wt 210 lb 4 oz (95.4 kg)   BMI 31.50 kg/m  Constitutional: generally well-appearing Psychiatric: alert and oriented x3 Eyes: extraocular movements intact Mouth: oral pharynx moist, no lesions Neck: supple no lymphadenopathy Cardiovascular: heart regular rate and rhythm Lungs: clear to auscultation bilaterally Abdomen: soft, nontender, nondistended, no obvious ascites, no peritoneal signs, normal bowel sounds Extremities: no lower extremity edema bilaterally Skin: no lesions on visible extremities   Assessment and plan: 30 y.o. female with  Chronic loose stools, intermittent  vomiting episodes  All of her changes started shortly after [place]. I suspect this is bile acid related. I recommended a trial of cholestyramine powder. One dose twice daily. She will call to update on her response, symptoms in 4-6 weeks. I am pretty certain this will help with her daily chronic loose stools. I am hoping it will also help with her intermittent episodes of vomiting. If it does not she may need further testing for those discrete episodes.   Rob Buntinganiel Jacobs, MD [facility] 07/27/2016, 2:28 PM  Cc: [facility]

## 2016-08-04 ENCOUNTER — Emergency Department (HOSPITAL_COMMUNITY)
Admission: EM | Admit: 2016-08-04 | Discharge: 2016-08-04 | Disposition: A | Discharge status: Home / Self Care | Payer: Medicaid Other | Attending: Emergency Medicine | Admitting: Emergency Medicine

## 2016-08-04 ENCOUNTER — Encounter (HOSPITAL_COMMUNITY): Payer: Self-pay | Admitting: Emergency Medicine

## 2016-08-04 ENCOUNTER — Emergency Department (HOSPITAL_COMMUNITY): Payer: Medicaid Other

## 2016-08-04 DIAGNOSIS — Z7984 Long term (current) use of oral hypoglycemic drugs: Secondary | ICD-10-CM | POA: Diagnosis not present

## 2016-08-04 DIAGNOSIS — Z87891 Personal history of nicotine dependence: Secondary | ICD-10-CM | POA: Diagnosis not present

## 2016-08-04 DIAGNOSIS — R0789 Other chest pain: Secondary | ICD-10-CM | POA: Diagnosis not present

## 2016-08-04 DIAGNOSIS — Z79899 Other long term (current) drug therapy: Secondary | ICD-10-CM | POA: Diagnosis not present

## 2016-08-04 DIAGNOSIS — E1165 Type 2 diabetes mellitus with hyperglycemia: Secondary | ICD-10-CM | POA: Diagnosis not present

## 2016-08-04 DIAGNOSIS — R739 Hyperglycemia, unspecified: Secondary | ICD-10-CM

## 2016-08-04 DIAGNOSIS — R079 Chest pain, unspecified: Secondary | ICD-10-CM | POA: Diagnosis present

## 2016-08-04 DIAGNOSIS — Z7982 Long term (current) use of aspirin: Secondary | ICD-10-CM | POA: Diagnosis not present

## 2016-08-04 HISTORY — DX: Hyperlipidemia, unspecified: E78.5

## 2016-08-04 LAB — BASIC METABOLIC PANEL
Anion gap: 11 (ref 5–15)
BUN: 5 mg/dL — AB (ref 6–20)
CHLORIDE: 102 mmol/L (ref 101–111)
CO2: 22 mmol/L (ref 22–32)
CREATININE: 0.51 mg/dL (ref 0.44–1.00)
Calcium: 9.4 mg/dL (ref 8.9–10.3)
GFR calc Af Amer: >60 mL/min (ref >60)
GFR calc non Af Amer: >60 mL/min (ref >60)
GLUCOSE: 175 mg/dL — AB (ref 65–99)
POTASSIUM: 3.9 mmol/L (ref 3.5–5.1)
SODIUM: 135 mmol/L (ref 135–145)

## 2016-08-04 LAB — CBC
HEMATOCRIT: 42.5 % (ref 36.0–46.0)
Hemoglobin: 14.5 g/dL (ref 12.0–15.0)
MCH: 30.9 pg (ref 26.0–34.0)
MCHC: 34.1 g/dL (ref 30.0–36.0)
MCV: 90.6 fL (ref 78.0–100.0)
PLATELETS: 409 10*3/uL — AB (ref 150–400)
RBC: 4.69 MIL/uL (ref 3.87–5.11)
RDW: 12.7 % (ref 11.5–15.5)
WBC: 8.5 10*3/uL (ref 4.0–10.5)

## 2016-08-04 LAB — I-STAT TROPONIN, ED: Troponin i, poc: 0 ng/mL (ref 0.00–0.08)

## 2016-08-04 LAB — D-DIMER, QUANTITATIVE: D-Dimer, Quant: 0.27 ug/mL-FEU (ref 0.00–0.50)

## 2016-08-04 LAB — LIPASE, BLOOD: Lipase: 27 U/L (ref 11–51)

## 2016-08-04 MED ORDER — IBUPROFEN 600 MG PO TABS: 600.0000 mg | ORAL_TABLET | Freq: Four times a day (QID) | ORAL | 0 refills | Status: DC | PRN

## 2016-08-04 NOTE — ED Triage Notes (Signed)
Pt started having tightness in her chest yesterday. Pt feels SOB. Denies N/V.

## 2016-08-04 NOTE — ED Provider Notes (Signed)
[facility]PT Provider Note   CSN: [id number] Arrival date & time: 08/04/16  1354     History   Chief Complaint Chief Complaint  Patient presents with  . Chest Pain    HPI Alison Rowe is a 30 y.o. female.  Pt presents to the ED today with CP and SOB.  She said that she also has a hx of DM, ran out of her supplies to check her sugar about 2 weeks ago.  She has no idea how high her blood sugar has been running.  She has been taking her medications.  She developed CP in the middle of her chest yesterday and it has not gone away.  The pt said that it does not go anywhere.  It does not hurt with a deep breath.      Past Medical History:  Diagnosis Date  . Gestational diabetes 2013  . Heart murmur   . Hyperlipidemia   . Post partum depression 2013  . Type II diabetes mellitus [facility](HCC)     Patient Active Problem List   Diagnosis Date Noted  . Breast abscess of female 12/11/2015  . Abscess of right breast   . Abscess of breast 10/25/2015  . Breast abscess 10/25/2015    Past Surgical History:  Procedure Laterality Date  . BREAST BIOPSY Right 12/11/2015   chronic abscess incision and drainage, incisional biopsy  . CESAREAN SECTION  2013  . EVACUATION BREAST HEMATOMA Right 10/26/2015   Procedure: IRRIGATION AND DEBRIDEMENT BREAST ;  Surgeon: Axel FillerArmando Ramirez, MD;  Location: [facility] OR;  Service: General;  Laterality: Right;  . INCISION AND DRAINAGE ABSCESS Right 12/11/2015   Procedure: INCISION AND DRAINAGE RIGHT BREAST ABSCESS;  Surgeon: Emelia LoronMatthew Wakefield, MD;  Location: [facility] OR;  Service: General;  Laterality: Right;  . LAPAROSCOPIC CHOLECYSTECTOMY  11/2013    OB History    No data available       Home Medications    Prior to Admission medications   Medication Sig Start Date End Date Taking? Authorizing Provider  ASPIRIN LOW DOSE 81 MG chewable tablet daily. 06/25/16   Historical Provider, MD  atorvastatin (LIPITOR) 80 MG tablet daily. 06/25/16   Historical Provider, MD   cholestyramine Lanetta Inch(QUESTRAN) 4 GM/DOSE powder Take 1 packet (4 g total) by mouth 2 (two) times daily with a meal. 07/27/16   Rachael Feeaniel P Jacobs, MD  glipiZIDE (GLUCOTROL) 10 MG tablet Take 5 mg by mouth 2 (two) times daily before a meal.  11/10/15   Historical Provider, MD  ibuprofen (ADVIL,MOTRIN) 600 MG tablet Take 1 tablet (600 mg total) by mouth every 6 (six) hours as needed. 08/04/16   Jacalyn LefevreJulie Darriel Sinquefield, MD  lisinopril (PRINIVIL,ZESTRIL) 2.5 MG tablet daily. 06/25/16   Historical Provider, MD  metFORMIN (GLUCOPHAGE) 1000 MG tablet Take 1,000 mg by mouth 2 (two) times daily. 11/10/15   Historical Provider, MD  NOVOLIN 70/30 (70-30) 100 UNIT/ML injection as directed. 06/25/16   Historical Provider, MD    Family History Family History  Problem Relation Age of Onset  . Diabetes Mother   . Diabetes Father   . Hyperlipidemia Father   . Hypertension Father   . Diabetes Maternal Grandmother   . Colon cancer Neg Hx   . Stomach cancer Neg Hx   . Esophageal cancer Neg Hx   . Rectal cancer Neg Hx   . Liver cancer Neg Hx     Social History Social History  Substance Use Topics  . Smoking status: Former Smoker    Packs/day:  0.12    Years: 1.00    Types: Cigarettes    Quit date: 12/08/2009  . Smokeless tobacco: Never Used  . Alcohol use 2.4 oz/week    4 Shots of liquor per week     Allergies   Patient has no known allergies.   Review of Systems Review of Systems  Respiratory: Positive for shortness of breath.   Cardiovascular: Positive for chest pain.  All other systems reviewed and are negative.    Physical Exam Updated Vital Signs BP 116/73 (BP Location: Left Arm)   Pulse 98   Temp 98.6 F (37 C) (Oral)   Resp 15   Ht 5\' 8"  (1.727 m)   Wt 211 lb (95.7 kg)   LMP 07/27/2016   SpO2 100%   BMI 32.08 kg/m   Physical Exam  Constitutional: She appears well-developed and well-nourished.  HENT:  Head: Normocephalic and atraumatic.  Right Ear: External ear normal.  Left Ear:  External ear normal.  Nose: Nose normal.  Mouth/Throat: Oropharynx is clear and moist.  Eyes: Conjunctivae and EOM are normal. Pupils are equal, round, and reactive to light.  Neck: Normal range of motion. Neck supple.  Cardiovascular: Normal rate, regular rhythm, normal heart sounds and intact distal pulses.   Pulmonary/Chest: Effort normal and breath sounds normal.  Abdominal: Soft. Bowel sounds are normal.  Musculoskeletal: Normal range of motion.  Neurological: She is alert.  Skin: Skin is warm. Capillary refill takes less than 2 seconds.  Psychiatric: She has a normal mood and affect. Her behavior is normal. Judgment and thought content normal.  Nursing note and vitals reviewed.    ED Treatments / Results  Labs (all labs ordered are listed, but only abnormal results are displayed) Labs Reviewed  BASIC METABOLIC PANEL - Abnormal; Notable for the following:       Result Value   Glucose, Bld 175 (*)    BUN 5 (*)    All other components within normal limits  CBC - Abnormal; Notable for the following:    Platelets 409 (*)    All other components within normal limits  D-DIMER, QUANTITATIVE (NOT AT [facility])  LIPASE, BLOOD  I-STAT TROPOININ, ED    EKG  EKG Interpretation None      EKG not crossing through on MUSE:  HR 101.  NSR.  No ST or T wave changes.  No STEMI.  Radiology Dg Chest 2 View  Result Date: 08/04/2016 CLINICAL DATA:  Chest tightness which began yesterday and is worse today. Shortness of breath with exertion. EXAM: CHEST  2 VIEW COMPARISON:  None. FINDINGS: The heart size and mediastinal contours are within normal limits. Both lungs are clear. The visualized skeletal structures are unremarkable. IMPRESSION: Negative two view chest x-ray Electronically Signed   By: Marin Roberts M.D.   On: 08/04/2016 15:20    Procedures Procedures (including critical care time)  Medications Ordered in ED Medications - No data to display   Initial Impression /  Assessment and Plan / ED Course  I have reviewed the triage vital signs and the nursing notes.  Pertinent labs & imaging results that were available during my care of the patient were reviewed by me and considered in my medical decision making (see chart for details).  Clinical Course     Pt has a rx waiting for her for her glucose checking supplies, so she does not need one from me.  Sx atypical for cp and patient is young, although she does have risk  factors.  Pt ok for d/c, but she needs to f/u with her pcp.  Final Clinical Impressions(s) / ED Diagnoses   Final diagnoses:  Atypical chest pain  Hyperglycemia    New Prescriptions New Prescriptions   IBUPROFEN (ADVIL,MOTRIN) 600 MG TABLET    Take 1 tablet (600 mg total) by mouth every 6 (six) hours as needed.     Jacalyn LefevreJulie Jowel Waltner, MD 08/04/16 [phone]

## 2016-08-04 NOTE — ED Notes (Signed)
Started yesterday with sob , when she talks and walks , chest pain in the middle of her chest , no nausea unknown what her sugar is as she has run out of supplies last time checked was 2 weeks ago and it was in the 200

## 2016-09-13 NOTE — L&D Delivery Note (Signed)
Patient is 31 y.o. G2P1001 5048w0d admitted for PPROM. S/p IOL with foley bulb, followed by Pitocin. Prenatal course also complicated by history of previous c-section and T2DM on insulin.  Delivery Note At 1:51 AM a viable female was delivered via Vaginal, Vacuum (Extractor) (Presentation:cephalic ;LOA).  APGAR: 7, 9; weight 9 lb 12.4 oz (4435 g).   Placenta status: spontaneous,intact .  Cord: 3 vessel  Anesthesia:  epidural Episiotomy: None Lacerations: 3rd degree;Perineal Suture Repair: 3.0 vicryl Est. Blood Loss (mL): 1000  Mom to postpartum.  Baby to Couplet care / Skin to Skin.  Upon arrival, patient was complete. She had been pushing for about 2.5 hours at this point. Fetal station was +3. Discussed risks and benefits of vacuum delivery and patient agreed to vacuum extraction. Kiwi vacuum applied to fetal head, careful to avoid maternal tissue. Vacuum pressure increased to green with first contractions. Fetal head delivered with first contraction in LOA position. Vacuum removed. Shoulder dystocia occurred. McRoberts maneuver attempted along with suprapubic pressure. Posterior arm delivered, then anterior shoulder delivered, followed by rest of baby's body. Baby was placed on maternal abdomen for oral suctioning, drying and stimulation. Delayed cord clamping performed. Placenta delivered spontaneously with gentle cord traction. Fundus firm with massage and Pitocin. However, patient continued to have trickle of blood. Methergine IM x 1 and cytotec buccal x 1 given. Fundus was firm and bleeding resolved. Perineum inspected and found to have 3rd degree laceration, which was repaired with 3-0 vicryl with good hemostasis achieved. Counts of sharps, instruments, and lap pads were all correct.  Dr. Adrian BlackwaterStinson was present for entire delivery.   Rolm BookbinderAmber Yale Golla, DO [place] Fellow

## 2017-01-17 LAB — OB RESULTS CONSOLE HEPATITIS B SURFACE ANTIGEN: Hepatitis B Surface Ag: NEGATIVE

## 2017-01-17 LAB — OB RESULTS CONSOLE RUBELLA ANTIBODY, IGM: Rubella: IMMUNE

## 2017-01-17 LAB — SICKLE CELL SCREEN: Sickle Cell Screen: NORMAL

## 2017-01-17 LAB — OB RESULTS CONSOLE GC/CHLAMYDIA
CHLAMYDIA, DNA PROBE: NEGATIVE
GC PROBE AMP, GENITAL: NEGATIVE

## 2017-01-17 LAB — OB RESULTS CONSOLE PLATELET COUNT: Platelets: 385 10*3/uL

## 2017-01-17 LAB — OB RESULTS CONSOLE ANTIBODY SCREEN: Antibody Screen: NEGATIVE

## 2017-01-17 LAB — OB RESULTS CONSOLE VARICELLA ZOSTER ANTIBODY, IGG: Varicella: IMMUNE

## 2017-01-17 LAB — OB RESULTS CONSOLE ABO/RH: RH Type: POSITIVE

## 2017-01-17 LAB — OB RESULTS CONSOLE HIV ANTIBODY (ROUTINE TESTING): HIV: NONREACTIVE

## 2017-01-17 LAB — OB RESULTS CONSOLE HGB/HCT, BLOOD
HCT: 38 %
Hemoglobin: 12.5 g/dL

## 2017-01-20 ENCOUNTER — Encounter: Payer: Self-pay | Admitting: *Deleted

## 2017-01-24 ENCOUNTER — Other Ambulatory Visit: Payer: Self-pay

## 2017-01-24 ENCOUNTER — Ambulatory Visit: Payer: Medicaid Other | Admitting: *Deleted

## 2017-01-24 ENCOUNTER — Encounter: Payer: Self-pay | Admitting: General Practice

## 2017-01-24 ENCOUNTER — Encounter: Payer: Medicaid Other | Attending: Family Medicine | Admitting: *Deleted

## 2017-01-24 DIAGNOSIS — Z713 Dietary counseling and surveillance: Secondary | ICD-10-CM | POA: Insufficient documentation

## 2017-01-24 DIAGNOSIS — O24111 Pre-existing diabetes mellitus, type 2, in pregnancy, first trimester: Secondary | ICD-10-CM | POA: Diagnosis not present

## 2017-01-24 DIAGNOSIS — E1165 Type 2 diabetes mellitus with hyperglycemia: Secondary | ICD-10-CM | POA: Insufficient documentation

## 2017-01-24 NOTE — Progress Notes (Signed)
  Patient was seen on 01/24/2017 for type 2 Diabetes with pregnancy self-management . She states history of GDM with last pregnancy 4 years ago. She was then diagnosed with Type 2 Diabetes a couple of years later. She is currently taking Glipizide, Metformin and Novolin R insulin @ sliding scale pre breakfast and supper if BG is above 150 mg/dl only. She is scheduled to see High Risk OB in 3 days. The following learning objectives were met by the patient :   States the definition of Type 2 Diabetes and pregnancy  States why dietary management is important in controlling blood glucose  Describes the effects of carbohydrates on blood glucose levels  Demonstrates ability to create a balanced meal plan  Demonstrates carbohydrate counting   States when to check blood glucose levels  Demonstrates proper blood glucose monitoring techniques  States the effect of stress and exercise on blood glucose levels  States the importance of limiting caffeine and abstaining from alcohol and smoking  Plan:  Aim for 3 Carb Choices per meal (45 grams) +/- 1 either way  Aim for 1-2 Carbs per snack Begin reading food labels for Total Carbohydrate of foods Consider  increasing your activity level by walking or other activity daily as tolerated Begin checking BG before breakfast and 2 hours after first bite of breakfast, lunch and dinner as directed by MD  Bring Log Book to every medical appointment   Take medication as directed by MD  Patient already has a meter: Accu  Chek but not sure which model And is testing pre breakfast and pre supper to determine if she needs correction dose of Novolin R.  Not after meals yet She states she is running out of strips and drums, I will ask RN to call in refills for her as soon as she lets this department know which model meter she has.  Patient states she has not had any BG above 150 mg/dl so has not taken any Novolin R to date.   Patient instructed to monitor glucose  levels: FBS: 60 - <95 2 hour: <120  Patient received the following handouts:  Nutrition Diabetes and Pregnancy  Carbohydrate Counting List  BG Log Sheet  Patient will be seen for follow-up in one month and as needed.

## 2017-01-26 ENCOUNTER — Encounter: Payer: Self-pay | Admitting: *Deleted

## 2017-01-26 LAB — HM PAP SMEAR: PAP SMEAR: NEGATIVE

## 2017-01-27 ENCOUNTER — Ambulatory Visit (INDEPENDENT_AMBULATORY_CARE_PROVIDER_SITE_OTHER): Payer: Medicaid Other | Admitting: Clinical

## 2017-01-27 ENCOUNTER — Ambulatory Visit (INDEPENDENT_AMBULATORY_CARE_PROVIDER_SITE_OTHER): Payer: Medicaid Other | Admitting: Family Medicine

## 2017-01-27 ENCOUNTER — Encounter: Payer: Self-pay | Admitting: *Deleted

## 2017-01-27 ENCOUNTER — Encounter: Payer: Self-pay | Admitting: Family Medicine

## 2017-01-27 VITALS — BP 121/74 | HR 100 | Wt 199.0 lb

## 2017-01-27 DIAGNOSIS — F4321 Adjustment disorder with depressed mood: Secondary | ICD-10-CM

## 2017-01-27 DIAGNOSIS — O0991 Supervision of high risk pregnancy, unspecified, first trimester: Secondary | ICD-10-CM

## 2017-01-27 DIAGNOSIS — O34219 Maternal care for unspecified type scar from previous cesarean delivery: Secondary | ICD-10-CM | POA: Insufficient documentation

## 2017-01-27 DIAGNOSIS — O24111 Pre-existing diabetes mellitus, type 2, in pregnancy, first trimester: Secondary | ICD-10-CM | POA: Diagnosis not present

## 2017-01-27 DIAGNOSIS — O0990 Supervision of high risk pregnancy, unspecified, unspecified trimester: Secondary | ICD-10-CM

## 2017-01-27 DIAGNOSIS — O24119 Pre-existing diabetes mellitus, type 2, in pregnancy, unspecified trimester: Secondary | ICD-10-CM | POA: Insufficient documentation

## 2017-01-27 LAB — POCT URINALYSIS DIP (DEVICE)
Bilirubin Urine: NEGATIVE
Glucose, UA: 250 mg/dL — AB
HGB URINE DIPSTICK: NEGATIVE
KETONES UR: NEGATIVE mg/dL
Leukocytes, UA: NEGATIVE
Nitrite: NEGATIVE
PROTEIN: NEGATIVE mg/dL
UROBILINOGEN UA: 0.2 mg/dL (ref 0.0–1.0)
pH: 5.5 (ref 5.0–8.0)

## 2017-01-27 MED ORDER — GLUCOSE BLOOD VI STRP: ORAL_STRIP | 12 refills | Status: DC

## 2017-01-27 MED ORDER — ACCU-CHEK MULTICLIX LANCETS MISC: 12 refills | Status: DC

## 2017-01-27 NOTE — Progress Notes (Signed)
Subjective:  Alison Rowe is a G2P1001 6926w1d being seen today for her first obstetrical visit.  Her obstetrical history is significant for DM2. Was on lisopril for renal protection, not HTN.. Patient does intend to breast feed. Pregnancy history fully reviewed.  Patient reports Hip pain, orthostasis..  BP 121/74   Pulse 100   Wt 199 lb (90.3 kg)   LMP 11/10/2016   BMI 30.26 kg/m   HISTORY: OB History  Gravida Para Term Preterm AB Living  2 1 1  0 0 1  SAB TAB Ectopic Multiple Live Births  0 0 0 0 1    # Outcome Date GA Lbr Len/2nd Weight Sex Delivery Anes PTL Lv  2 Current           1 Term 07/06/12 7667w0d  7 lb 9 oz (3.43 kg) F CS-Unspec   LIV     Complications: Gestational diabetes      Past Medical History:  Diagnosis Date  . Gestational diabetes 2013  . Heart murmur   . Hyperlipidemia   . Post partum depression 2013  . Type II diabetes mellitus (HCC)     Past Surgical History:  Procedure Laterality Date  . BREAST BIOPSY Right 12/11/2015   chronic abscess incision and drainage, incisional biopsy  . CESAREAN SECTION  2013  . EVACUATION BREAST HEMATOMA Right 10/26/2015   Procedure: IRRIGATION AND DEBRIDEMENT BREAST ;  Surgeon: Axel FillerArmando Ramirez, MD;  Location: [facility] OR;  Service: General;  Laterality: Right;  . INCISION AND DRAINAGE ABSCESS Right 12/11/2015   Procedure: INCISION AND DRAINAGE RIGHT BREAST ABSCESS;  Surgeon: Emelia LoronMatthew Wakefield, MD;  Location: [facility] OR;  Service: General;  Laterality: Right;  . LAPAROSCOPIC CHOLECYSTECTOMY  11/2013    Family History  Problem Relation Age of Onset  . Diabetes Mother   . Diabetes Father   . Hyperlipidemia Father   . Hypertension Father   . Diabetes Maternal Grandmother      Exam    System:     Skin: normal coloration and turgor, no rashes    Neurologic: oriented, gait normal; reflexes normal and symmetric   Extremities: normal strength, tone, and muscle mass, no deformities, no erythema, induration, or nodules   HEENT  PERRLA and extra ocular movement intact   Mouth/Teeth mucous membranes moist, pharynx normal without lesions   Neck supple and no masses   Cardiovascular: regular rate and rhythm, no murmurs or gallops   Respiratory:  appears well, vitals normal, no respiratory distress, acyanotic, normal RR, ear and throat exam is normal, neck free of mass or lymphadenopathy, chest clear, no wheezing, crepitations, rhonchi, normal symmetric air entry   Abdomen: soft, non-tender; bowel sounds normal; no masses,  no organomegaly      Assessment:    Pregnancy: G2P1001 Patient Active Problem List   Diagnosis Date Noted  . Supervision of high risk pregnancy, antepartum 01/27/2017  . Type 2 diabetes mellitus in pregnancy 01/27/2017  . History of cesarean section complicating pregnancy 01/27/2017      Plan:   1. Supervision of high risk pregnancy, antepartum FHT and FH normal - glucose blood (ACCU-CHEK AVIVA PLUS) test strip; Use as instructed (Patient not taking: Reported on 01/27/2017)  Dispense: 100 each; Refill: 12 - Lancets (ACCU-CHEK MULTICLIX) lancets; Use as instructed (Patient not taking: Reported on 01/27/2017)  Dispense: 100 each; Refill: 12  2. Pregnancy with type 2 diabetes mellitus in first trimester CMP, CBC, UP:C, fetal echo, optho referal. Discussed CBG goals. Continue metformin and glyburide for now. F/u  in 2 weeks for CBGs. Start ASA 81mg . - glucose blood (ACCU-CHEK AVIVA PLUS) test strip; Use as instructed (Patient not taking: Reported on 01/27/2017)  Dispense: 100 each; Refill: 12 - Lancets (ACCU-CHEK MULTICLIX) lancets; Use as instructed (Patient not taking: Reported on 01/27/2017)  Dispense: 100 each; Refill: 12 - Protein / creatinine ratio, urine - CBC - Comprehensive metabolic panel - Hemoglobin A1c - TSH - Ambulatory referral to Ophthalmology - US Fetal Echocardiography; Future  3. History of cesarean section complicating pregnancy Discussed TOLAC. Pt considering.   Problem  list reviewed and updated. 100% of 30 min visit spent on counseling and coordination of care.     Levie Heritage 12/24/2016

## 2017-01-27 NOTE — BH Specialist Note (Signed)
Integrated Behavioral Health Initial Visit  MRN: [id number] Name: Alison Rowe   Session Start time: 4:25 Session End time: 5:05 Total time: 45 minutes  Type of Service: Integrated Behavioral Health- Individual/Family Interpretor:No. Interpretor Name and Language: n/a   Warm Hand Off Completed.       SUBJECTIVE: Alison Rowe is a 31 y.o. female accompanied by patient. Patient was referred by Dr Adrian BlackwaterStinson for depression. Patient reports the following symptoms/concerns: Pt states her primary concern is feelings of depression returning this pregnancy; needs to talk about her feelings to cope. 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; aspires to work in hospital setting Self-Care: [place] Life Changes: Current pregnancy  GOALS ADDRESSED: Patient will reduce symptoms of: depression and increase knowledge and/or ability of: coping skills and also: Increase healthy adjustment to current life circumstances and Increase adequate support systems for patient/family   INTERVENTIONS: Solution-Focused Strategies, Psychoeducation and/or Health Education and Link to [facility] Resources  Standardized Assessments completed: GAD-7 and PHQ 9  ASSESSMENT: Patient currently experiencing Adjustment disorder with depressed mood. Patient may benefit from psychoeducation and brief therapeutic intervention regarding coping with symptoms of depression  PLAN: 1. Follow up with behavioral health clinician on : Two weeks 2. Behavioral recommendations:  -Read educational material regarding coping with symptoms of depression -Consider [employer] family nights with daughter, Wednesdays 5-7pm -Consider visiting local park  weekly 3. Referral(s): Integrated Art gallery managerBehavioral Health Services (In Clinic) and [employer] Resources:  [facility], [facility]  Valetta CloseJamie C [place], [place]   Depression screen [facility] 2/[facility] 01/27/2017  Decreased Interest 1  Down, Depressed, Hopeless 1  PHQ - 2 Score 2  Altered sleeping 0  Tired, decreased energy 1  Change in appetite 0  Feeling bad or failure about yourself  0  Trouble concentrating 0  Moving slowly or fidgety/restless 0  Suicidal thoughts 0  PHQ-9 Score 3   GAD 7 : Generalized Anxiety Score 01/27/2017  Nervous, Anxious, on Edge 1  Control/stop worrying 1  Worry too much - different things 0  Trouble relaxing 0  Restless 0  Easily annoyed or irritable 0  Afraid - awful might happen 0  Total GAD 7 Score 2

## 2017-01-28 LAB — COMPREHENSIVE METABOLIC PANEL
ALK PHOS: 53 IU/L (ref 39–117)
ALT: 19 IU/L (ref 0–32)
AST: 12 IU/L (ref 0–40)
Albumin/Globulin Ratio: 1.3 (ref 1.2–2.2)
Albumin: 4 g/dL (ref 3.5–5.5)
BUN/Creatinine Ratio: 12 (ref 9–23)
BUN: 6 mg/dL (ref 6–20)
Bilirubin Total: 0.2 mg/dL (ref 0.0–1.2)
CALCIUM: 9.6 mg/dL (ref 8.7–10.2)
CO2: 20 mmol/L (ref 18–29)
CREATININE: 0.5 mg/dL — AB (ref 0.57–1.00)
Chloride: 100 mmol/L (ref 96–106)
GFR calc Af Amer: 152 mL/min/{1.73_m2} (ref >59)
GFR, EST NON AFRICAN AMERICAN: 132 mL/min/{1.73_m2} (ref >59)
GLUCOSE: 196 mg/dL — AB (ref 65–99)
Globulin, Total: 3 g/dL (ref 1.5–4.5)
Potassium: 4 mmol/L (ref 3.5–5.2)
SODIUM: 136 mmol/L (ref 134–144)
Total Protein: 7 g/dL (ref 6.0–8.5)

## 2017-01-28 LAB — CBC
Hematocrit: 36 % (ref 34.0–46.6)
Hemoglobin: 12.4 g/dL (ref 11.1–15.9)
MCH: 30.6 pg (ref 26.6–33.0)
MCHC: 34.4 g/dL (ref 31.5–35.7)
MCV: 89 fL (ref 79–97)
PLATELETS: 389 10*3/uL — AB (ref 150–379)
RBC: 4.05 x10E6/uL (ref 3.77–5.28)
RDW: 14 % (ref 12.3–15.4)
WBC: 12.4 10*3/uL — AB (ref 3.4–10.8)

## 2017-01-28 LAB — PROTEIN / CREATININE RATIO, URINE
Creatinine, Urine: 220.1 mg/dL
PROTEIN/CREAT RATIO: 88 mg/g{creat} (ref 0–200)
Protein, Ur: 19.3 mg/dL

## 2017-01-28 LAB — HEMOGLOBIN A1C
ESTIMATED AVERAGE GLUCOSE: 180 mg/dL
Hgb A1c MFr Bld: 7.9 % — ABNORMAL HIGH (ref 4.8–5.6)

## 2017-01-28 LAB — TSH: TSH: 1.29 u[IU]/mL (ref 0.450–4.500)

## 2017-02-14 ENCOUNTER — Ambulatory Visit: Payer: Medicaid Other | Admitting: Clinical

## 2017-02-14 ENCOUNTER — Ambulatory Visit (INDEPENDENT_AMBULATORY_CARE_PROVIDER_SITE_OTHER): Payer: Medicaid Other | Admitting: Obstetrics & Gynecology

## 2017-02-14 VITALS — BP 107/62 | HR 81 | Wt 202.3 lb

## 2017-02-14 DIAGNOSIS — O0990 Supervision of high risk pregnancy, unspecified, unspecified trimester: Secondary | ICD-10-CM

## 2017-02-14 DIAGNOSIS — O0991 Supervision of high risk pregnancy, unspecified, first trimester: Secondary | ICD-10-CM | POA: Diagnosis not present

## 2017-02-14 DIAGNOSIS — O24111 Pre-existing diabetes mellitus, type 2, in pregnancy, first trimester: Secondary | ICD-10-CM | POA: Diagnosis not present

## 2017-02-14 DIAGNOSIS — F4321 Adjustment disorder with depressed mood: Secondary | ICD-10-CM

## 2017-02-14 MED ORDER — INSULIN ASPART 100 UNIT/ML FLEXPEN: PEN_INJECTOR | SUBCUTANEOUS | 11 refills | Status: DC

## 2017-02-14 MED ORDER — ASPIRIN LOW DOSE 81 MG PO CHEW: 81.0000 mg | CHEWABLE_TABLET | Freq: Every day | ORAL | 11 refills | Status: DC

## 2017-02-14 MED ORDER — INSULIN GLARGINE 100 UNIT/ML ~~LOC~~ SOLN: 15.0000 [IU] | Freq: Every day | SUBCUTANEOUS | Status: DC

## 2017-02-14 NOTE — BH Specialist Note (Signed)
Integrated Behavioral Health Follow Up Visit  MRN: [id number] Name: Alison Rowe   Session Start time: 11:25 Session End time: 11:40 Total time: 15 minutes Number of Integrated Behavioral Health Clinician visits: 2/10  Type of Service: Integrated Behavioral Health- Individual/Family Interpretor:No. Interpretor Name and Language: n/a   Warm Hand Off Completed.       SUBJECTIVE: Alison Rowe is a 31 y.o. female accompanied by patient and husband and daughter. Patient was referred by f/u Dr Adrian BlackwaterStinson for depression. Patient reports the following symptoms/concerns: Pt states that she has been feeling much better since last visit; getting outside, and [place] has helped. 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 Patient may benefit from supportive counseling today.Marland Kitchen.  PLAN: 1. Follow up with behavioral health clinician on : As needed 2. Behavioral recommendations:  -Daily walk outside, for as long as it remains helpful 3. Referral(s): Integrated [facility]al Health Services (In Clinic)   Valetta CloseJamie C [place], [place]WA  Depression screen [facility] 2/9 02/14/2017 01/27/2017  Decreased Interest 1 1  Down, Depressed, Hopeless 0 1  PHQ - 2 Score 1 2  Altered sleeping 1 0  Tired, decreased energy 1 1  Change in appetite 1 0  Feeling bad  or failure about yourself  0 0  Trouble concentrating 0 0  Moving slowly or fidgety/restless 0 0  Suicidal thoughts 0 0  PHQ-9 Score 4 3   GAD 7 : Generalized Anxiety Score 02/14/2017 01/27/2017  Nervous, Anxious, on Edge 1 1  Control/stop worrying 0 1  Worry too much - different things 0 0  Trouble relaxing 0 0  Restless 0 0  Easily annoyed or irritable 1 0  Afraid - awful might happen 0 0  Total GAD 7 Score 2 2

## 2017-02-14 NOTE — Progress Notes (Signed)
     PRENATAL VISIT NOTE  Subjective:  Alison Rowe is a 31 y.o. G2P1001 at 5175w5d being seen today for ongoing prenatal care.  She is currently monitored for the following issues for this high-risk pregnancy and has Supervision of high risk pregnancy, antepartum; Type 2 diabetes mellitus in pregnancy; and History of cesarean section complicating pregnancy on her problem list.  Patient reports mild feet and ankle pain..  Contractions: Not present. Vag. Bleeding: None.  Movement: Present. Denies leaking of fluid.   The following portions of the patient's history were reviewed and updated as appropriate: allergies, current medications, past family history, past medical history, past social history, past surgical history and problem list. Problem list updated.  Objective:   Vitals:   02/14/17 1055  BP: 107/62  Pulse: 81  Weight: 202 lb 4.8 oz (91.8 kg)    Fetal Status: Fetal Heart Rate (bpm): 150   Movement: Present     General:  Alert, oriented and cooperative. Patient is in no acute distress.  Skin: Skin is warm and dry. No rash noted.   Cardiovascular: Normal heart rate noted  Respiratory: Normal respiratory effort, no problems with respiration noted  Abdomen: Soft, gravid, appropriate for gestational age. Pain/Pressure: Present     Pelvic:  Cervical exam deferred        Extremities: Normal range of motion.  Edema: Trace  Mental Status: Normal mood and affect. Normal behavior. Normal judgment and thought content.   Assessment and Plan:  Pregnancy: G2P1001 at 2275w5d  1. Supervision of high risk pregnancy, antepartum - AFP TETRA--Full Quad -Need RPR result  2. Pregnancy with type 2 diabetes mellitus in first trimester--Worsening--not in glycemic control  Fasting:  120-130 2 hour breakfast:  200-230 2 hr lunch:  150-200 2 hr dinner:   Stop Glipizide; start Lantus 15 units nightly and Novalog 14 units before breakfast, 10 units before lunch, 10 units before dinner  Preterm  labor symptoms and general obstetric precautions including but not limited to vaginal bleeding, contractions, leaking of fluid and fetal movement were reviewed in detail with the patient. Please refer to After Visit Summary for other counseling recommendations.  Return in about 1 week (around 02/21/2017).  25 minutes spent face to face with patient with >50% counseling  Elsie LincolnKelly Leggett, MD

## 2017-02-15 ENCOUNTER — Telehealth: Payer: Self-pay | Admitting: Obstetrics & Gynecology

## 2017-02-15 DIAGNOSIS — O24112 Pre-existing diabetes mellitus, type 2, in pregnancy, second trimester: Secondary | ICD-10-CM

## 2017-02-15 NOTE — Telephone Encounter (Signed)
Patient called to say she needed needles for her novolin flexpen .

## 2017-02-16 ENCOUNTER — Other Ambulatory Visit: Payer: Self-pay | Admitting: Lab

## 2017-02-16 DIAGNOSIS — O0990 Supervision of high risk pregnancy, unspecified, unspecified trimester: Secondary | ICD-10-CM

## 2017-02-16 DIAGNOSIS — O0992 Supervision of high risk pregnancy, unspecified, second trimester: Secondary | ICD-10-CM

## 2017-02-16 DIAGNOSIS — Z363 Encounter for antenatal screening for malformations: Secondary | ICD-10-CM

## 2017-02-16 MED ORDER — "INSULIN SYRINGE 31G X 5/16"" 0.5 ML MISC": 1.0000 | Freq: Every day | 8 refills | Status: DC

## 2017-02-16 NOTE — Progress Notes (Unsigned)
Called patient to give her the Anatomy appointment 7/11 at 10:30am and Fetal Echo appointment at 7/6 at 2:00pm. Also Patient notified me she needed insulin needles, I ordered her 8 refills.

## 2017-02-18 LAB — AFP TETRA
DIA VALUE (EIA): 154.41 pg/mL
DSR (By Age)    1 IN: 789
GESTATIONAL AGE AFP: 13.7 wk
MSAFP: 10.1 ng/mL
MSHCG: 21638 m[IU]/mL
Maternal Age At EDD: 28.9 yr
T18 (By Age): 1:3074 {titer}
WEIGHT: 202 [lb_av]
uE3 Value: 0.19 ng/mL

## 2017-02-18 MED ORDER — INSULIN PEN NEEDLE 31G X 8 MM MISC: 1.0000 | 5 refills | Status: DC

## 2017-02-18 NOTE — Telephone Encounter (Signed)
Patient called and left message stating she has been having difficulty all week getting the needles she needs for her insulin pen. Please call back. Called [facility] & ordered correct rx. Called & informed patient. Patient verbalized understanding & had no questions

## 2017-02-21 ENCOUNTER — Encounter: Payer: Self-pay | Admitting: General Practice

## 2017-02-24 ENCOUNTER — Ambulatory Visit: Payer: Medicaid Other | Admitting: *Deleted

## 2017-02-24 ENCOUNTER — Encounter: Payer: Medicaid Other | Attending: Family Medicine | Admitting: *Deleted

## 2017-02-24 ENCOUNTER — Other Ambulatory Visit: Payer: Self-pay | Admitting: Obstetrics and Gynecology

## 2017-02-24 ENCOUNTER — Ambulatory Visit (INDEPENDENT_AMBULATORY_CARE_PROVIDER_SITE_OTHER): Payer: Medicaid Other | Admitting: Obstetrics and Gynecology

## 2017-02-24 VITALS — BP 107/63 | HR 95 | Wt 200.8 lb

## 2017-02-24 DIAGNOSIS — O24111 Pre-existing diabetes mellitus, type 2, in pregnancy, first trimester: Secondary | ICD-10-CM | POA: Insufficient documentation

## 2017-02-24 DIAGNOSIS — O24119 Pre-existing diabetes mellitus, type 2, in pregnancy, unspecified trimester: Secondary | ICD-10-CM

## 2017-02-24 DIAGNOSIS — O0992 Supervision of high risk pregnancy, unspecified, second trimester: Secondary | ICD-10-CM

## 2017-02-24 DIAGNOSIS — E1165 Type 2 diabetes mellitus with hyperglycemia: Secondary | ICD-10-CM | POA: Insufficient documentation

## 2017-02-24 DIAGNOSIS — O34219 Maternal care for unspecified type scar from previous cesarean delivery: Secondary | ICD-10-CM

## 2017-02-24 DIAGNOSIS — Z713 Dietary counseling and surveillance: Secondary | ICD-10-CM | POA: Insufficient documentation

## 2017-02-24 DIAGNOSIS — O24112 Pre-existing diabetes mellitus, type 2, in pregnancy, second trimester: Secondary | ICD-10-CM

## 2017-02-24 DIAGNOSIS — O0990 Supervision of high risk pregnancy, unspecified, unspecified trimester: Secondary | ICD-10-CM

## 2017-02-24 LAB — POCT URINALYSIS DIP (DEVICE)
GLUCOSE, UA: NEGATIVE mg/dL
Hgb urine dipstick: NEGATIVE
Leukocytes, UA: NEGATIVE
Nitrite: NEGATIVE
PH: 5 (ref 5.0–8.0)
Protein, ur: 30 mg/dL — AB
Specific Gravity, Urine: >=1.03 (ref 1.005–1.030)
Urobilinogen, UA: 0.2 mg/dL (ref 0.0–1.0)

## 2017-02-24 MED ORDER — INSULIN GLARGINE 100 UNIT/ML ~~LOC~~ SOLN: 15.0000 [IU] | Freq: Every day | SUBCUTANEOUS | 11 refills | Status: DC

## 2017-02-24 NOTE — Progress Notes (Signed)
Prenatal Visit Note Date: 02/24/2017 Clinic: [facility]  Subjective:  Alison Rowe is a 31 y.o. G2P1001 at 715w1d being seen today for ongoing prenatal care.  She is currently monitored for the following issues for this high-risk pregnancy and has Supervision of high risk pregnancy, antepartum; Type 2 diabetes mellitus in pregnancy; and History of cesarean section complicating pregnancy on her problem list.  Patient reports no complaints.   Contractions: Not present. Vag. Bleeding: None.  Movement: Present. Denies leaking of fluid.   The following portions of the patient's history were reviewed and updated as appropriate: allergies, current medications, past family history, past medical history, past social history, past surgical history and problem list. Problem list updated.  Objective:   Vitals:   02/24/17 0916  BP: 107/63  Pulse: 95  Weight: 200 lb 12.8 oz (91.1 kg)    Fetal Status: Fetal Heart Rate (bpm): 147   Movement: Present     General:  Alert, oriented and cooperative. Patient is in no acute distress.  Skin: Skin is warm and dry. No rash noted.   Cardiovascular: Normal heart rate noted  Respiratory: Normal respiratory effort, no problems with respiration noted  Abdomen: Soft, gravid, appropriate for gestational age. Pain/Pressure: Present     Pelvic:  Cervical exam deferred        Extremities: Normal range of motion.  Edema: None  Mental Status: Normal mood and affect. Normal behavior. Normal judgment and thought content.   Urinalysis:      Assessment and Plan:  Pregnancy: G2P1001 at 605w1d  1. Type 2 diabetes mellitus during pregnancy, antepartum Unfortunately her lantus wasn't sent into the pharmacy so she was only taking the novolog 14/10 (she only eats twice per day) starting on 6/10. Prior to that she was doing achs checks and doing SSI. She is also on metformin 1000/1000. Her fastings are in the 120s-140s and 2hr PP are labile range but  usually the mid 100s.  Pt told to stay on her current regimen and Rx for lantus 15 qhs given to her. Pt seen by DM Meriam Sprague([place]) today.  Fetal echo scheduled. F/u anatomy u/s.  - RPR - AFP TETRA - Ambulatory referral to Pediatric Cardiology - Hemoglobin A1c  2. Supervision of high risk pregnancy, antepartum afp tetra today  3. History of cesarean section complicating pregnancy Counsel later in pregnancy  Preterm labor symptoms and general obstetric precautions including but not limited to vaginal bleeding, contractions, leaking of fluid and fetal movement were reviewed in detail with the patient. Please refer to After Visit Summary for other counseling recommendations.  Return in about 8 days (around 03/04/2017).   [place] BingPickens, Kendryck Lacroix, MD

## 2017-02-24 NOTE — Progress Notes (Signed)
  Patient was seen on 02/24/2017 for type 2 Diabetes with pregnancy self-management follow up visit. She states her Regular insulin and Glipizide was discontinued last week and Lantus and Novolog in pens were prescribed. She states the pen needles did not fit the Novolog pen so it took a few days to get the correct pen needles. She started the Novolog @ 14 units for breakfast and 10 units for lunch and supper this past Sunday 02/20/2017.  She also states the Lantus Rx was not at the pharmacy so she has not started that yet. She states she gets up around 10 AM so eats breakfast around noon and supper when her husband gets home from work around 9 PM. She snacks on fruit during the day.   Intervention:  We reviewed Carb Counting today and I provided her with additional handouts   I suggested she add some protein to her mid afternoon snacks since she does not eat a lunch  Discussed insulin action of Lantus and Novolog and explained how the Lantus will help bring her fasting BG down better. (FBG running 131-144 mg/dl for past week)  I provided her with new BG Log Sheet that she can carry in her meter case and reminded her to bring to every provider appointment .   Plan: continue to- Aim for 3 Carb Choices per meal (45 grams) +/- 1 either way  Aim for 1-2 Carbs per snack Reading food labels for Total Carbohydrate of foods Consider  increasing your activity level by walking or other activity daily as tolerated Begin checking BG before breakfast and 2 hours after first bite of breakfast, lunch and dinner as directed by MD  Bring Log Book to every medical appointment   Take medication as directed by MD  Patient instructed to monitor glucose levels: FBS: 60 - <95 2 hour: <120  Patient received the following handouts:  Carb Counting handout  Insulin action handout  BG Log Sheet  Patient will be seen for follow-up in one month.

## 2017-02-24 NOTE — Progress Notes (Signed)
Fetal Echo scheduled for July 6th @ 1400.  Pt notified.

## 2017-02-28 LAB — HGB A1C W/O EAG: Hgb A1c MFr Bld: 7 % — ABNORMAL HIGH (ref 4.8–5.6)

## 2017-02-28 LAB — SPECIMEN STATUS REPORT

## 2017-03-02 ENCOUNTER — Ambulatory Visit (INDEPENDENT_AMBULATORY_CARE_PROVIDER_SITE_OTHER): Payer: Medicaid Other | Admitting: Obstetrics & Gynecology

## 2017-03-02 DIAGNOSIS — O0992 Supervision of high risk pregnancy, unspecified, second trimester: Secondary | ICD-10-CM

## 2017-03-02 DIAGNOSIS — O34219 Maternal care for unspecified type scar from previous cesarean delivery: Secondary | ICD-10-CM | POA: Diagnosis not present

## 2017-03-02 DIAGNOSIS — O0990 Supervision of high risk pregnancy, unspecified, unspecified trimester: Secondary | ICD-10-CM

## 2017-03-02 LAB — AFP TETRA
DIA Mom Value: 0.54
DIA Value (EIA): 83.66 pg/mL
DSR (BY AGE) 1 IN: 789
DSR (Second Trimester) 1 IN: 10000
Gestational Age: 15 WEEKS
MSAFP MOM: 0.67
MSAFP: 15.6 ng/mL
MSHCG MOM: 0.32
MSHCG: 13791 m[IU]/mL
Maternal Age At EDD: 28.9 yr
Osb Risk: 10000
Test Results:: NEGATIVE
UE3 MOM: 0.61
UE3 VALUE: 0.31 ng/mL
Weight: 200 [lb_av]

## 2017-03-02 LAB — RPR: RPR Ser Ql: NONREACTIVE

## 2017-03-02 MED ORDER — INSULIN GLARGINE 100 UNIT/ML ~~LOC~~ SOLN
18.0000 [IU] | Freq: Every day | SUBCUTANEOUS | 11 refills | Status: DC
Start: 2017-03-02 — End: 2017-03-21

## 2017-03-02 MED ORDER — INSULIN ASPART 100 UNIT/ML FLEXPEN: PEN_INJECTOR | SUBCUTANEOUS | 11 refills | Status: DC

## 2017-03-02 NOTE — Progress Notes (Signed)
REpeat quad screen negative   PRENATAL VISIT NOTE  Subjective:  Alison Rowe is a 31 y.o. G2P1001 at 2974w0d being seen today for ongoing prenatal care.  She is currently monitored for the following issues for this high-risk pregnancy and has Supervision of high risk pregnancy, antepartum; Type 2 diabetes mellitus in pregnancy; and History of cesarean section complicating pregnancy on her problem list.  Patient reports headache.  Contractions: Not present.  .  Movement: Present. Denies leaking of fluid.   The following portions of the patient's history were reviewed and updated as appropriate: allergies, current medications, past family history, past medical history, past social history, past surgical history and problem list. Problem list updated.  Objective:   Vitals:   03/02/17 1237  BP: 118/65  Pulse: (!) 108  Weight: 205 lb (93 kg)    Fetal Status: Fetal Heart Rate (bpm): 152   Movement: Present     General:  Alert, oriented and cooperative. Patient is in no acute distress.  Skin: Skin is warm and dry. No rash noted.   Cardiovascular: Normal heart rate noted  Respiratory: Normal respiratory effort, no problems with respiration noted  Abdomen: Soft, gravid, appropriate for gestational age. Pain/Pressure: Present     Pelvic:  Cervical exam deferred        Extremities: Normal range of motion.     Mental Status: Normal mood and affect. Normal behavior. Normal judgment and thought content.   Assessment and Plan:  Pregnancy: G2P1001 at 4074w0d  1. Supervision of high risk pregnancy, antepartum FBS 119-140 and PP nearly all > 150, increased her insulin dose  General obstetric precautions including but not limited to vaginal bleeding, contractions, leaking of fluid and fetal movement were reviewed in detail with the patient. Please refer to After Visit Summary for other counseling recommendations.  Return in about 2 weeks (around 03/16/2017).   Scheryl DarterJames Arnold, MD

## 2017-03-02 NOTE — Patient Instructions (Signed)

## 2017-03-21 ENCOUNTER — Ambulatory Visit (INDEPENDENT_AMBULATORY_CARE_PROVIDER_SITE_OTHER): Payer: Medicaid Other | Admitting: Family Medicine

## 2017-03-21 VITALS — BP 106/54 | HR 97 | Wt 204.0 lb

## 2017-03-21 DIAGNOSIS — O24112 Pre-existing diabetes mellitus, type 2, in pregnancy, second trimester: Secondary | ICD-10-CM | POA: Diagnosis not present

## 2017-03-21 DIAGNOSIS — M549 Dorsalgia, unspecified: Secondary | ICD-10-CM | POA: Diagnosis not present

## 2017-03-21 DIAGNOSIS — O0992 Supervision of high risk pregnancy, unspecified, second trimester: Secondary | ICD-10-CM

## 2017-03-21 DIAGNOSIS — M9903 Segmental and somatic dysfunction of lumbar region: Secondary | ICD-10-CM | POA: Diagnosis not present

## 2017-03-21 DIAGNOSIS — O26892 Other specified pregnancy related conditions, second trimester: Secondary | ICD-10-CM | POA: Diagnosis not present

## 2017-03-21 DIAGNOSIS — O99891 Other specified diseases and conditions complicating pregnancy: Secondary | ICD-10-CM

## 2017-03-21 DIAGNOSIS — O9989 Other specified diseases and conditions complicating pregnancy, childbirth and the puerperium: Secondary | ICD-10-CM

## 2017-03-21 DIAGNOSIS — O0990 Supervision of high risk pregnancy, unspecified, unspecified trimester: Secondary | ICD-10-CM

## 2017-03-21 LAB — POCT URINALYSIS DIP (DEVICE)
Bilirubin Urine: NEGATIVE
GLUCOSE, UA: NEGATIVE mg/dL
GLUCOSE, UA: NEGATIVE mg/dL
Hgb urine dipstick: NEGATIVE
Hgb urine dipstick: NEGATIVE
KETONES UR: NEGATIVE mg/dL
Ketones, ur: NEGATIVE mg/dL
LEUKOCYTES UA: NEGATIVE
Leukocytes, UA: NEGATIVE
Nitrite: NEGATIVE
Nitrite: NEGATIVE
PROTEIN: 30 mg/dL — AB
Protein, ur: NEGATIVE mg/dL
Specific Gravity, Urine: >=1.03 (ref 1.005–1.030)
UROBILINOGEN UA: 0.2 mg/dL (ref 0.0–1.0)
Urobilinogen, UA: 0.2 mg/dL (ref 0.0–1.0)
pH: 5.5 (ref 5.0–8.0)
pH: 5.5 (ref 5.0–8.0)

## 2017-03-21 MED ORDER — INSULIN GLARGINE 100 UNIT/ML ~~LOC~~ SOLN: 15.0000 [IU] | Freq: Two times a day (BID) | SUBCUTANEOUS | 11 refills | Status: DC

## 2017-03-21 MED ORDER — CYCLOBENZAPRINE HCL 10 MG PO TABS: 5.0000 mg | ORAL_TABLET | Freq: Three times a day (TID) | ORAL | 2 refills | Status: DC | PRN

## 2017-03-21 NOTE — Progress Notes (Signed)
Subjective:  Alison Rowe is a 31 y.o. G2P1001 at 4372w5d being seen today for ongoing prenatal care.  She is currently monitored for the following issues for this high-risk pregnancy and has Supervision of high risk pregnancy, antepartum; Type 2 diabetes mellitus in pregnancy; and History of cesarean section complicating pregnancy on her problem list.  GDM: Patient taking metformin and insulin.  Reports no hypoglycemic episodes.  Tolerating medication well. Did bring log book Fasting: 110-115 2hr PP: 118-  Patient reports backache. Radiating down buttock on right. Contractions: Irritability. Vag. Bleeding: None.  Movement: Present. Denies leaking of fluid.   The following portions of the patient's history were reviewed and updated as appropriate: allergies, current medications, past family history, past medical history, past social history, past surgical history and problem list. Problem list updated.  Objective:   Vitals:   03/21/17 0812  BP: (!) 106/54  Pulse: 97  Weight: 204 lb (92.5 kg)    Fetal Status: Fetal Heart Rate (bpm): 141   Movement: Present     General:  Alert, oriented and cooperative. Patient is in no acute distress.  Skin: Skin is warm and dry. No rash noted.   Cardiovascular: Normal heart rate noted  Respiratory: Normal respiratory effort, no problems with respiration noted  Abdomen: Soft, gravid, appropriate for gestational age. Pain/Pressure: Present     Pelvic: Vag. Bleeding: None     Cervical exam deferred        Extremities: Normal range of motion.  Edema: Trace  Mental Status: Normal mood and affect. Normal behavior. Normal judgment and thought content.   MSK: Restriction, tenderness, tissue texture changes, and paraspinal spasm in the right lumbar spine  Neuro: Moves all four extremities with no focal neurological deficit   OSE: Head   Cervical   Thoracic   Rib   Lumbar L4 ESRL, L5 ESRR  Sacrum L/L torsion  Pelvis Right anterior innom.       Urinalysis:      Assessment and Plan:  Pregnancy: G2P1001 at 4872w5d  1. Supervision of high risk pregnancy, antepartum FHT and FH normal  2. Pregnancy with type 2 diabetes mellitus in second trimester Increase lantus to 15u BID. Fetal echo, per patient, normal.  3. Back pain affecting pregnancy in second trimester 4. Somatic Dysfunction  OMT done after patient permission. HVLA technique utilized. Patient tolerated procedure well. Flexeril 5-10mg  TID.   Preterm labor symptoms and general obstetric precautions including but not limited to vaginal bleeding, contractions, leaking of fluid and fetal movement were reviewed in detail with the patient. Please refer to After Visit Summary for other counseling recommendations.  No Follow-up on file.   Levie HeritageStinson, Darold Miley J, DO

## 2017-03-21 NOTE — Progress Notes (Signed)
States that she has been having severe back pain for one week.

## 2017-03-23 ENCOUNTER — Other Ambulatory Visit: Payer: Self-pay | Admitting: Obstetrics & Gynecology

## 2017-03-23 ENCOUNTER — Encounter (HOSPITAL_COMMUNITY): Payer: Self-pay

## 2017-03-23 ENCOUNTER — Ambulatory Visit (HOSPITAL_COMMUNITY)
Admission: RE | Admit: 2017-03-23 | Discharge: 2017-03-23 | Disposition: A | Discharge status: Home / Self Care | Payer: Medicaid Other | Source: Ambulatory Visit | Attending: Obstetrics & Gynecology | Admitting: Obstetrics & Gynecology

## 2017-03-23 ENCOUNTER — Other Ambulatory Visit (HOSPITAL_COMMUNITY): Payer: Self-pay | Admitting: *Deleted

## 2017-03-23 DIAGNOSIS — O0992 Supervision of high risk pregnancy, unspecified, second trimester: Secondary | ICD-10-CM | POA: Insufficient documentation

## 2017-03-23 DIAGNOSIS — Z3A19 19 weeks gestation of pregnancy: Secondary | ICD-10-CM

## 2017-03-23 DIAGNOSIS — O0990 Supervision of high risk pregnancy, unspecified, unspecified trimester: Secondary | ICD-10-CM

## 2017-03-23 DIAGNOSIS — Z8632 Personal history of gestational diabetes: Secondary | ICD-10-CM | POA: Diagnosis not present

## 2017-03-23 DIAGNOSIS — O09292 Supervision of pregnancy with other poor reproductive or obstetric history, second trimester: Secondary | ICD-10-CM

## 2017-03-23 DIAGNOSIS — O99212 Obesity complicating pregnancy, second trimester: Secondary | ICD-10-CM

## 2017-03-23 DIAGNOSIS — Z363 Encounter for antenatal screening for malformations: Secondary | ICD-10-CM

## 2017-03-23 DIAGNOSIS — E669 Obesity, unspecified: Secondary | ICD-10-CM | POA: Diagnosis not present

## 2017-03-23 DIAGNOSIS — Z6831 Body mass index (BMI) 31.0-31.9, adult: Secondary | ICD-10-CM | POA: Insufficient documentation

## 2017-03-23 DIAGNOSIS — Z98891 History of uterine scar from previous surgery: Secondary | ICD-10-CM

## 2017-03-23 DIAGNOSIS — O34219 Maternal care for unspecified type scar from previous cesarean delivery: Secondary | ICD-10-CM | POA: Insufficient documentation

## 2017-03-23 DIAGNOSIS — O24312 Unspecified pre-existing diabetes mellitus in pregnancy, second trimester: Secondary | ICD-10-CM

## 2017-03-24 ENCOUNTER — Other Ambulatory Visit: Payer: Self-pay

## 2017-03-24 ENCOUNTER — Telehealth: Payer: Self-pay | Admitting: General Practice

## 2017-03-24 NOTE — Telephone Encounter (Signed)
Returned patient's phone call. She reports continued back pain. States Flexeril has helped minimally, it just makes her really sleepy. Patient states she missed work last weekend due to this pain and would like a note for work this weekend. Per Dr Shawnie PonsPratt, patient may have letter for this weekend but not reminder of pregnancy. Patient will need to find other pain relief measures. Informed patient she may come pick up letter tomorrow but we cannot continue to write her out. Suggested heatwraps/heating pad or belly band for support. Patient verbalized understanding & had no other questions

## 2017-03-24 NOTE — Telephone Encounter (Signed)
-----   Message from Jonathon BellowsBeronica Mendez sent at 03/23/2017 12:04 PM EDT ----- Contact: [phone] Patient has been having lower back pain, which has been discusses with provider. Patient states that meds are helping a little but is causing her problems to be able to work.  Please call patient, she has several questions.   Thanks!

## 2017-03-25 ENCOUNTER — Other Ambulatory Visit (HOSPITAL_COMMUNITY): Payer: Self-pay | Admitting: *Deleted

## 2017-03-25 DIAGNOSIS — O24119 Pre-existing diabetes mellitus, type 2, in pregnancy, unspecified trimester: Secondary | ICD-10-CM

## 2017-03-25 DIAGNOSIS — Z7984 Long term (current) use of oral hypoglycemic drugs: Principal | ICD-10-CM

## 2017-03-31 ENCOUNTER — Other Ambulatory Visit: Payer: Self-pay

## 2017-04-07 ENCOUNTER — Ambulatory Visit (INDEPENDENT_AMBULATORY_CARE_PROVIDER_SITE_OTHER): Payer: Medicaid Other | Admitting: Obstetrics and Gynecology

## 2017-04-07 ENCOUNTER — Ambulatory Visit: Payer: Medicaid Other | Admitting: *Deleted

## 2017-04-07 ENCOUNTER — Encounter: Payer: Medicaid Other | Attending: Family Medicine | Admitting: *Deleted

## 2017-04-07 ENCOUNTER — Other Ambulatory Visit: Payer: Self-pay

## 2017-04-07 VITALS — BP 113/73 | HR 94 | Wt 203.9 lb

## 2017-04-07 DIAGNOSIS — E1165 Type 2 diabetes mellitus with hyperglycemia: Secondary | ICD-10-CM | POA: Diagnosis not present

## 2017-04-07 DIAGNOSIS — O24111 Pre-existing diabetes mellitus, type 2, in pregnancy, first trimester: Secondary | ICD-10-CM | POA: Diagnosis not present

## 2017-04-07 DIAGNOSIS — O34219 Maternal care for unspecified type scar from previous cesarean delivery: Secondary | ICD-10-CM

## 2017-04-07 DIAGNOSIS — O24112 Pre-existing diabetes mellitus, type 2, in pregnancy, second trimester: Secondary | ICD-10-CM

## 2017-04-07 DIAGNOSIS — O24414 Gestational diabetes mellitus in pregnancy, insulin controlled: Secondary | ICD-10-CM

## 2017-04-07 DIAGNOSIS — Z713 Dietary counseling and surveillance: Secondary | ICD-10-CM | POA: Diagnosis not present

## 2017-04-07 DIAGNOSIS — O0990 Supervision of high risk pregnancy, unspecified, unspecified trimester: Secondary | ICD-10-CM

## 2017-04-07 MED ORDER — HYDROXYZINE HCL 50 MG PO TABS: 50.0000 mg | ORAL_TABLET | Freq: Four times a day (QID) | ORAL | 1 refills | Status: DC | PRN

## 2017-04-07 NOTE — Progress Notes (Signed)
Follow up visit for diabetes education: Patient was seen on 7/26//2018 for type 2 Diabetes with pregnancy self-management follow up visit. She states she is currently taking Lantus @ 18 units twice a day and Novolog @ 18 units before breakfast, 15 units before lunch and supper in pens. She is not having any trouble taking the Lantus or Novolog, reports BG are typically within target ranges (FBG 90-105 and post meal 116-123 mg/dl). She is having trouble sleeping and thinks she had a panic attack during the night this past Monday night. She plans to discuss this with her MD later today.   Intervention:  She expresses good understanding of Carb Counting today   She feel she is getting enough to eat and enjoys snacks if she gets hungry between meals.   I explained [employer] to her, she is interested so I signed her up today. .   Plan: continue to- Aim for 3 Carb Choices per meal (45 grams) +/- 1 either way  Aim for 1-2 Carbs per snack Reading food labels for Total Carbohydrate of foods Continue with your activity level by walking or other activity daily as tolerated Continue checking BG before breakfast and 2 hours after first bite of breakfast, lunch and dinner as directed by MD  Bring Log Book to every medical appointment   Take medication as directed by MD  Patient instructed to monitor glucose levels: FBS: 60 - <95 2 hour: <120  Patient received the following handouts:  No new handouts today  Patient will be seen for follow-up as needed.

## 2017-04-07 NOTE — Progress Notes (Signed)
Prenatal Visit Note Date: 04/07/2017 Clinic: [facility]  Subjective:  Alison Rowe is a 31 y.o. G2P1001 at 3020w1d being seen today for ongoing prenatal care.  She is currently monitored for the following issues for this high-risk pregnancy and has Supervision of high risk pregnancy, antepartum; Type 2 diabetes mellitus in pregnancy; and History of cesarean section complicating pregnancy on her problem list.  Patient reports no complaints.   Contractions: Irritability. Vag. Bleeding: None.  Movement: Present. Denies leaking of fluid.   The following portions of the patient's history were reviewed and updated as appropriate: allergies, current medications, past family history, past medical history, past social history, past surgical history and problem list. Problem list updated.  Objective:   Vitals:   04/07/17 1403  BP: 113/73  Pulse: 94  Weight: 203 lb 14.4 oz (92.5 kg)    Fetal Status: Fetal Heart Rate (bpm): 147   Movement: Present     General:  Alert, oriented and cooperative. Patient is in no acute distress.  Skin: Skin is warm and dry. No rash noted.   Cardiovascular: Normal heart rate noted  Respiratory: Normal respiratory effort, no problems with respiration noted  Abdomen: Soft, gravid, appropriate for gestational age. Pain/Pressure: Present     Pelvic:  Cervical exam deferred        Extremities: Normal range of motion.  Edema: Trace  Mental Status: Normal mood and affect. Normal behavior. Normal judgment and thought content.   Urinalysis:      Assessment and Plan:  Pregnancy: G2P1001 at 7020w1d  1. History of cesarean section complicating pregnancy D/w pt re: delivery mode later in pregnancy.   2. Supervision of high risk pregnancy, antepartum Routine care  3. Pregnancy with type 2 diabetes mellitus in second trimester Normal BS values (forgot log) with Lantus 18 and novolog 18/15/15.  Pt had fetal echo earlier this week. Results not up  yet. Continue baby ASA, f/u growth already scheduled. Consider a1c check nv.   Preterm labor symptoms and general obstetric precautions including but not limited to vaginal bleeding, contractions, leaking of fluid and fetal movement were reviewed in detail with the patient. Please refer to After Visit Summary for other counseling recommendations.  Return in about 2 weeks (around 04/21/2017) for rob.   [place] BingPickens, Abdullah Rizzi, MD

## 2017-04-20 ENCOUNTER — Ambulatory Visit (HOSPITAL_COMMUNITY)
Admission: RE | Admit: 2017-04-20 | Discharge: 2017-04-20 | Disposition: A | Discharge status: Home / Self Care | Payer: Medicaid Other | Source: Ambulatory Visit | Attending: Obstetrics & Gynecology | Admitting: Obstetrics & Gynecology

## 2017-04-20 ENCOUNTER — Encounter (HOSPITAL_COMMUNITY): Payer: Self-pay

## 2017-04-20 ENCOUNTER — Ambulatory Visit (INDEPENDENT_AMBULATORY_CARE_PROVIDER_SITE_OTHER): Payer: Medicaid Other | Admitting: Obstetrics and Gynecology

## 2017-04-20 VITALS — BP 108/66 | HR 99 | Wt 206.2 lb

## 2017-04-20 DIAGNOSIS — O99212 Obesity complicating pregnancy, second trimester: Secondary | ICD-10-CM | POA: Insufficient documentation

## 2017-04-20 DIAGNOSIS — O09292 Supervision of pregnancy with other poor reproductive or obstetric history, second trimester: Secondary | ICD-10-CM | POA: Diagnosis not present

## 2017-04-20 DIAGNOSIS — O24112 Pre-existing diabetes mellitus, type 2, in pregnancy, second trimester: Secondary | ICD-10-CM

## 2017-04-20 DIAGNOSIS — E669 Obesity, unspecified: Secondary | ICD-10-CM | POA: Insufficient documentation

## 2017-04-20 DIAGNOSIS — O34219 Maternal care for unspecified type scar from previous cesarean delivery: Secondary | ICD-10-CM

## 2017-04-20 DIAGNOSIS — Z6831 Body mass index (BMI) 31.0-31.9, adult: Secondary | ICD-10-CM | POA: Diagnosis not present

## 2017-04-20 DIAGNOSIS — O0990 Supervision of high risk pregnancy, unspecified, unspecified trimester: Secondary | ICD-10-CM

## 2017-04-20 DIAGNOSIS — O24312 Unspecified pre-existing diabetes mellitus in pregnancy, second trimester: Secondary | ICD-10-CM

## 2017-04-20 DIAGNOSIS — Z3A23 23 weeks gestation of pregnancy: Secondary | ICD-10-CM | POA: Diagnosis not present

## 2017-04-20 LAB — POCT URINALYSIS DIP (DEVICE)
BILIRUBIN URINE: NEGATIVE
Glucose, UA: NEGATIVE mg/dL
HGB URINE DIPSTICK: NEGATIVE
LEUKOCYTES UA: NEGATIVE
Nitrite: NEGATIVE
Protein, ur: 30 mg/dL — AB
Urobilinogen, UA: 0.2 mg/dL (ref 0.0–1.0)
pH: 5.5 (ref 5.0–8.0)

## 2017-04-20 LAB — GLUCOSE, CAPILLARY: Glucose-Capillary: 137 mg/dL — ABNORMAL HIGH (ref 65–99)

## 2017-04-20 MED ORDER — INSULIN GLARGINE 100 UNIT/ML ~~LOC~~ SOLN: 20.0000 [IU] | Freq: Two times a day (BID) | SUBCUTANEOUS | 11 refills | Status: DC

## 2017-04-20 MED ORDER — INSULIN ASPART 100 UNIT/ML FLEXPEN: PEN_INJECTOR | SUBCUTANEOUS | 11 refills | Status: DC

## 2017-04-20 NOTE — Addendum Note (Signed)
Addended by: Cheree DittoGRAHAM, DEMETRICE A on: 04/20/2017 02:31 PM   Modules accepted: Orders

## 2017-04-20 NOTE — Progress Notes (Signed)
Prenatal Visit Note Date: 04/20/2017 Clinic: [facility]  Subjective:  Alison Rowe is a 31 y.o. G2P1001 at 2020w0d being seen today for ongoing prenatal care.  She is currently monitored for the following issues for this high-risk pregnancy and has Supervision of high risk pregnancy, antepartum; Type 2 diabetes mellitus in pregnancy; and History of cesarean section complicating pregnancy on her problem list.  Patient reports no complaints.   Contractions: Not present. Vag. Bleeding: None.  Movement: Present. Denies leaking of fluid.   The following portions of the patient's history were reviewed and updated as appropriate: allergies, current medications, past family history, past medical history, past social history, past surgical history and problem list. Problem list updated.  Objective:   Vitals:   04/20/17 0913  BP: 108/66  Pulse: 99  Weight: 206 lb 3.2 oz (93.5 kg)    Fetal Status: Fetal Heart Rate (bpm): 140   Movement: Present     General:  Alert, oriented and cooperative. Patient is in no acute distress.  Skin: Skin is warm and dry. No rash noted.   Cardiovascular: Normal heart rate noted  Respiratory: Normal respiratory effort, no problems with respiration noted  Abdomen: Soft, gravid, appropriate for gestational age. Pain/Pressure: Present     Pelvic:  Cervical exam deferred        Extremities: Normal range of motion.  Edema: None  Mental Status: Normal mood and affect. Normal behavior. Normal judgment and thought content.   Urinalysis: Urine Protein: 1+ Urine Glucose: Negative  Assessment and Plan:  Pregnancy: G2P1001 at 5020w0d  1. Pregnancy with type 2 diabetes mellitus in second trimester Forgot log book. BS today is 137 (it's a fasting b/c she didn't eat anything today). Patient states AM fastings are in the 120s-140s and 2hr lunch and dinner is 130s-140s.  Currently on metformin 1000/1000 and lantus 18/18 and novolog 18/15/15-->metformin  1000/1000 and lantus 20/20 and novolog 20/20/20  Has growth scan today. Negative fetal echo (see care everywhere). - Hemoglobin A1c  2. Supervision of high risk pregnancy, antepartum Routine care.  - Hemoglobin A1c  Preterm labor symptoms and general obstetric precautions including but not limited to vaginal bleeding, contractions, leaking of fluid and fetal movement were reviewed in detail with the patient. Please refer to After Visit Summary for other counseling recommendations.  Return in about 1 week (around 04/27/2017) for 7-10d rob .   [place] BingPickens, Jaylani Mcguinn, MD

## 2017-04-20 NOTE — Progress Notes (Signed)
BG=137 Phq-9=10, GAD-7=18, Jamie and Dr Vergie LivingPickens aware. Patient declines to be seen today d/t time constraints.

## 2017-04-20 NOTE — Addendum Note (Signed)
Addended by: Cheree DittoGRAHAM, DEMETRICE A on: 04/20/2017 11:56 AM   Modules accepted: Orders

## 2017-04-20 NOTE — Addendum Note (Signed)
Addended by: Cheree DittoGRAHAM, Tramya Schoenfelder A on: 04/20/2017 11:59 AM   Modules accepted: Orders

## 2017-04-20 NOTE — Addendum Note (Signed)
Addended by: Cheree DittoGRAHAM, DEMETRICE A on: 04/20/2017 09:53 AM   Modules accepted: Orders

## 2017-04-21 LAB — HEMOGLOBIN A1C
Est. average glucose Bld gHb Est-mCnc: 146 mg/dL
HEMOGLOBIN A1C: 6.7 % — AB (ref 4.8–5.6)

## 2017-05-03 ENCOUNTER — Ambulatory Visit (INDEPENDENT_AMBULATORY_CARE_PROVIDER_SITE_OTHER): Payer: Medicaid Other | Admitting: Obstetrics and Gynecology

## 2017-05-03 ENCOUNTER — Encounter: Payer: Self-pay | Admitting: Obstetrics and Gynecology

## 2017-05-03 VITALS — BP 114/62 | HR 105 | Wt 210.3 lb

## 2017-05-03 DIAGNOSIS — O0990 Supervision of high risk pregnancy, unspecified, unspecified trimester: Secondary | ICD-10-CM

## 2017-05-03 DIAGNOSIS — O24112 Pre-existing diabetes mellitus, type 2, in pregnancy, second trimester: Secondary | ICD-10-CM

## 2017-05-03 DIAGNOSIS — O34219 Maternal care for unspecified type scar from previous cesarean delivery: Secondary | ICD-10-CM

## 2017-05-03 MED ORDER — INSULIN ASPART 100 UNIT/ML FLEXPEN: PEN_INJECTOR | SUBCUTANEOUS | 11 refills | Status: DC

## 2017-05-03 NOTE — Progress Notes (Signed)
Patient here today for Routine OB visit [redacted]w[redacted]d

## 2017-05-03 NOTE — Progress Notes (Signed)
Subjective:  Alison Rowe is a 31 y.o. G2P1001 at [redacted]w[redacted]d being seen today for ongoing prenatal care.  She is currently monitored for the following issues for this high-risk pregnancy and has Supervision of high risk pregnancy, antepartum; Type 2 diabetes mellitus in pregnancy; and History of cesarean section complicating pregnancy on her problem list.  Patient reports no complaints. She   Contractions: Not present. Vag. Bleeding: None.  Movement: Present. Denies leaking of fluid.   #Anxiety: improved with hydroxyzine however medication makes her really drowsy which is hard for her while trying to watch her daughter.  #DM: did not bring log. Checks blood sugar 3x/day. Fasting ranges from 90-120 and postprandial 120-160  The following portions of the patient's history were reviewed and updated as appropriate: allergies, current medications, past family history, past medical history, past social history, past surgical history and problem list. Problem list updated.  Objective:   Vitals:   05/03/17 1356  BP: 114/62  Pulse: (!) 105  Weight: 210 lb 4.8 oz (95.4 kg)    Fetal Status: Fetal Heart Rate (bpm): 150   Movement: Present     General:  Alert, oriented and cooperative. Patient is in no acute distress.  Skin: Skin is warm and dry. No rash noted.   Cardiovascular: Normal heart rate noted  Respiratory: Normal respiratory effort, no problems with respiration noted  Abdomen: Soft, gravid, appropriate for gestational age. Pain/Pressure: Present     Pelvic: Vag. Bleeding: None     Cervical exam deferred        Extremities: Normal range of motion.  Edema: Trace  Mental Status: Normal mood and affect. Normal behavior. Normal judgment and thought content.   Urinalysis:      Assessment and Plan:  Pregnancy: G2P1001 at [redacted]w[redacted]d  1. Supervision of high risk pregnancy, antepartum Continue routine prenatal care. 28 wk labs at next visit.   2. History of cesarean section complicating  pregnancy Desires TOLAC.  3. Pregnancy with type 2 diabetes mellitus in second trimester Uncontrolled blood sugars. Does not have log again today. Will increase Novolog to 25U with meals. Currently only eating 2 meals a day. Continue Lantus 20U BID and metformin 1000mg  BID. Next growth scan on 05/18/17.  Continue ASA  Preterm labor symptoms and general obstetric precautions including but not limited to vaginal bleeding, contractions, leaking of fluid and fetal movement were reviewed in detail with the patient. Please refer to After Visit Summary for other counseling recommendations.  Return in about 4 weeks (around 05/31/2017).   Caryl Ada, DO OB [facility], [facility] 05/03/2017, 2:03 PM

## 2017-05-03 NOTE — Progress Notes (Signed)
Fetal Echo scheduled with [facility]

## 2017-05-03 NOTE — Progress Notes (Deleted)
Makes her really tired Cant get up in morning Working   Doing well until anxiety Higher on those days No log 3x/day Fasting and afeter her x2 meals a day 90-110  120-160 novolog x2 with meals 20 Metformin x2   Taking aspirin and PNV   Increase novolog to 25U with meals

## 2017-05-03 NOTE — Patient Instructions (Addendum)
Increase Novolog to 25U with meals Bring in glucose log next visit   Second Trimester of Pregnancy The second trimester is from week 13 through week 28, month 4 through 6. This is often the time in pregnancy that you feel your best. Often times, morning sickness has lessened or quit. You may have more energy, and you may get hungry more often. Your unborn baby (fetus) is growing rapidly. At the end of the sixth month, he or she is about 9 inches long and weighs about 1 pounds. You will likely feel the baby move (quickening) between 18 and 20 weeks of pregnancy. Follow these instructions at home:  Avoid all smoking, herbs, and alcohol. Avoid drugs not approved by your doctor.  Do not use any tobacco products, including cigarettes, chewing tobacco, and electronic cigarettes. If you need help quitting, ask your doctor. You may get counseling or other support to help you quit.  Only take medicine as told by your doctor. Some medicines are safe and some are not during pregnancy.  Exercise only as told by your doctor. Stop exercising if you start having cramps.  Eat regular, healthy meals.  Wear a good support bra if your breasts are tender.  Do not use hot tubs, steam rooms, or saunas.  Wear your seat belt when driving.  Avoid raw meat, uncooked cheese, and liter boxes and soil used by cats.  Take your prenatal vitamins.  Take 1500-2000 milligrams of calcium daily starting at the 20th week of pregnancy until you deliver your baby.  Try taking medicine that helps you poop (stool softener) as needed, and if your doctor approves. Eat more fiber by eating fresh fruit, vegetables, and whole grains. Drink enough fluids to keep your pee (urine) clear or pale yellow.  Take warm water baths (sitz baths) to soothe pain or discomfort caused by hemorrhoids. Use hemorrhoid cream if your doctor approves.  If you have puffy, bulging veins (varicose veins), wear support hose. Raise (elevate) your feet  for 15 minutes, 3-4 times a day. Limit salt in your diet.  Avoid heavy lifting, wear low heals, and sit up straight.  Rest with your legs raised if you have leg cramps or low back pain.  Visit your dentist if you have not gone during your pregnancy. Use a soft toothbrush to brush your teeth. Be gentle when you floss.  You can have sex (intercourse) unless your doctor tells you not to.  Go to your doctor visits. Get help if:  You feel dizzy.  You have mild cramps or pressure in your lower belly (abdomen).  You have a nagging pain in your belly area.  You continue to feel sick to your stomach (nauseous), throw up (vomit), or have watery poop (diarrhea).  You have bad smelling fluid coming from your vagina.  You have pain with peeing (urination). Get help right away if:  You have a fever.  You are leaking fluid from your vagina.  You have spotting or bleeding from your vagina.  You have severe belly cramping or pain.  You lose or gain weight rapidly.  You have trouble catching your breath and have chest pain.  You notice sudden or extreme puffiness (swelling) of your face, hands, ankles, feet, or legs.  You have not felt the baby move in over an hour.  You have severe headaches that do not go away with medicine.  You have vision changes. This information is not intended to replace advice given to you by your health  care provider. Make sure you discuss any questions you have with your health care provider. Document Released: 11/24/2009 Document Revised: 02/05/2016 Document Reviewed: 10/31/2012 Elsevier Interactive Patient Education  2017 [employer].

## 2017-05-18 ENCOUNTER — Ambulatory Visit (HOSPITAL_COMMUNITY): Payer: Medicaid Other

## 2017-05-24 ENCOUNTER — Ambulatory Visit (HOSPITAL_COMMUNITY)
Admission: RE | Admit: 2017-05-24 | Discharge: 2017-05-24 | Disposition: A | Discharge status: Home / Self Care | Payer: Medicaid Other | Source: Ambulatory Visit | Attending: Maternal and Fetal Medicine | Admitting: Maternal and Fetal Medicine

## 2017-05-24 ENCOUNTER — Other Ambulatory Visit (HOSPITAL_COMMUNITY): Payer: Self-pay | Admitting: *Deleted

## 2017-05-24 ENCOUNTER — Other Ambulatory Visit (HOSPITAL_COMMUNITY): Payer: Self-pay | Admitting: Maternal and Fetal Medicine

## 2017-05-24 ENCOUNTER — Encounter (HOSPITAL_COMMUNITY): Payer: Self-pay

## 2017-05-24 DIAGNOSIS — Z7984 Long term (current) use of oral hypoglycemic drugs: Secondary | ICD-10-CM

## 2017-05-24 DIAGNOSIS — O09892 Supervision of other high risk pregnancies, second trimester: Secondary | ICD-10-CM | POA: Insufficient documentation

## 2017-05-24 DIAGNOSIS — O99212 Obesity complicating pregnancy, second trimester: Secondary | ICD-10-CM

## 2017-05-24 DIAGNOSIS — O34219 Maternal care for unspecified type scar from previous cesarean delivery: Secondary | ICD-10-CM

## 2017-05-24 DIAGNOSIS — Z3A27 27 weeks gestation of pregnancy: Secondary | ICD-10-CM | POA: Diagnosis not present

## 2017-05-24 DIAGNOSIS — O24912 Unspecified diabetes mellitus in pregnancy, second trimester: Secondary | ICD-10-CM | POA: Insufficient documentation

## 2017-05-24 DIAGNOSIS — O24119 Pre-existing diabetes mellitus, type 2, in pregnancy, unspecified trimester: Secondary | ICD-10-CM

## 2017-05-24 DIAGNOSIS — E669 Obesity, unspecified: Secondary | ICD-10-CM | POA: Diagnosis not present

## 2017-06-01 ENCOUNTER — Ambulatory Visit (INDEPENDENT_AMBULATORY_CARE_PROVIDER_SITE_OTHER): Payer: Medicaid Other | Admitting: Obstetrics and Gynecology

## 2017-06-01 ENCOUNTER — Encounter: Payer: Self-pay | Admitting: General Practice

## 2017-06-01 ENCOUNTER — Encounter: Payer: Self-pay | Admitting: Obstetrics and Gynecology

## 2017-06-01 VITALS — BP 124/73 | HR 122 | Wt 210.0 lb

## 2017-06-01 DIAGNOSIS — O24113 Pre-existing diabetes mellitus, type 2, in pregnancy, third trimester: Secondary | ICD-10-CM

## 2017-06-01 DIAGNOSIS — O0993 Supervision of high risk pregnancy, unspecified, third trimester: Secondary | ICD-10-CM | POA: Diagnosis not present

## 2017-06-01 DIAGNOSIS — O34219 Maternal care for unspecified type scar from previous cesarean delivery: Secondary | ICD-10-CM

## 2017-06-01 DIAGNOSIS — Z23 Encounter for immunization: Secondary | ICD-10-CM

## 2017-06-01 DIAGNOSIS — O0990 Supervision of high risk pregnancy, unspecified, unspecified trimester: Secondary | ICD-10-CM

## 2017-06-01 NOTE — Progress Notes (Addendum)
Prenatal Visit Note Date: 06/01/2017 Clinic: [facility]  Subjective:  Alison Rowe is a 31 y.o. G2P1001 at [redacted]w[redacted]d being seen today for ongoing prenatal care.  She is currently monitored for the following issues for this high-risk pregnancy and has Supervision of high risk pregnancy, antepartum; Type 2 diabetes mellitus in pregnancy; and History of cesarean section complicating pregnancy on her problem list.  Patient reports no complaints.   Contractions: Irritability. Vag. Bleeding: None.  Movement: Present. Denies leaking of fluid.   The following portions of the patient's history were reviewed and updated as appropriate: allergies, current medications, past family history, past medical history, past social history, past surgical history and problem list. Problem list updated.  Objective:   Vitals:   06/01/17 1304  BP: 124/73  Pulse: (!) 122  Weight: 95.3 kg (210 lb)    Fetal Status: Fetal Heart Rate (bpm): 145   Movement: Present     General:  Alert, oriented and cooperative. Patient is in no acute distress.  Skin: Skin is warm and dry. No rash noted.   Cardiovascular: Normal heart rate noted  Respiratory: Normal respiratory effort, no problems with respiration noted  Abdomen: Soft, gravid, appropriate for gestational age. Pain/Pressure: Present     Pelvic:  Cervical exam deferred        Extremities: Normal range of motion.  Edema: None  Mental Status: Normal mood and affect. Normal behavior. Normal judgment and thought content.   Urinalysis:      Assessment and Plan:  Pregnancy: G2P1001 at [redacted]w[redacted]d  1. Supervision of high risk pregnancy, antepartum Routine care. - CBC - RPR - HIV antibody (with reflex) - Tdap vaccine greater than or equal to 7yo IM  2. Pregnancy with type 2 diabetes mellitus in third trimester Encouraged to check BS more often, including an AM fasting. She checks it twice a day (2hr after breakfast and dinner and in the 110s-130s if  she "cheats"). Will keep her on metformin 1000/1000, lantus 20/20 and novolog 25/25/25. Pt told can increase novolog 1-3u prn if going to have high carb meal. Has rpt u/s later this month (had borderline poly and close to lga fetus with large ac). Told pt about need for closer surveillance starting at 32wks.  - CBC - RPR - HIV antibody (with reflex) - Tdap vaccine greater than or equal to 7yo IM - Hgb A1c w/o eAG  3. History of cesarean section complicating pregnancy Pt states she had 07/06/2012 c-section at term. It was at [facility] in [place], [place]. She came in PROM and states FHR was non reassuring and she was only 3cm so they recommended a c-section; she states she was never told she couldn't tolac. Will have pt sign ROI form to get official opnote but I told her that based on what she's telling me, she can tolac. Can have her sign consent later - CBC - RPR - HIV antibody (with reflex) - Tdap vaccine greater than or equal to 7yo IM  Preterm labor symptoms and general obstetric precautions including but not limited to vaginal bleeding, contractions, leaking of fluid and fetal movement were reviewed in detail with the patient. Please refer to After Visit Summary for other counseling recommendations.  Return in about 2 weeks (around 06/15/2017) for rob.    Bing, MD

## 2017-06-01 NOTE — Progress Notes (Signed)
Patient reports contractions multiple times this week

## 2017-06-02 ENCOUNTER — Telehealth: Payer: Self-pay | Admitting: Obstetrics and Gynecology

## 2017-06-02 LAB — HGB A1C W/O EAG: Hgb A1c MFr Bld: 7.7 % — ABNORMAL HIGH (ref 4.8–5.6)

## 2017-06-02 LAB — CBC
Hematocrit: 36.3 % (ref 34.0–46.6)
Hemoglobin: 11.8 g/dL (ref 11.1–15.9)
MCH: 30.1 pg (ref 26.6–33.0)
MCHC: 32.5 g/dL (ref 31.5–35.7)
MCV: 93 fL (ref 79–97)
Platelets: 328 10*3/uL (ref 150–379)
RBC: 3.92 x10E6/uL (ref 3.77–5.28)
RDW: 13.5 % (ref 12.3–15.4)
WBC: 9.2 10*3/uL (ref 3.4–10.8)

## 2017-06-02 LAB — RPR: RPR: NONREACTIVE

## 2017-06-02 LAB — HIV ANTIBODY (ROUTINE TESTING W REFLEX): HIV SCREEN 4TH GENERATION: NONREACTIVE

## 2017-06-02 MED ORDER — INSULIN GLARGINE 100 UNIT/ML ~~LOC~~ SOLN: 22.0000 [IU] | Freq: Two times a day (BID) | SUBCUTANEOUS | 11 refills | Status: DC

## 2017-06-02 MED ORDER — INSULIN ASPART 100 UNIT/ML FLEXPEN: PEN_INJECTOR | SUBCUTANEOUS | 11 refills | Status: DC

## 2017-06-02 NOTE — Telephone Encounter (Signed)
OB Telephone Note  Patient called at [phone] and generic VM picked up and VM left to have patient call the clinic back when she gets a chance.  Her a1c on 9/19 was 7.7 which corresponds to an average BS in the 170s and it's worse than her prior one of 6.7 a month ago. If patient calls back, please strongly encourage her to check her BS more often (four times a day with an AM fasting and 2 hour post prandials) and be seen late next week in clinic. If patient can't do this, offer 48 hour admission to patient for blood sugar control and insulin adjustment.  Cornelia Copa MD Attending [facility] [employer] (Faculty Practice) 06/02/2017 Time: [phone]

## 2017-06-02 NOTE — Telephone Encounter (Addendum)
OB Telephone Note I spoke with Shinita and stressed importance of needing to do qid checks with m-f risks, namely iufd if poor BS control. I told her to stay on her metformin 1000/1000 but based on her a1c to increase her insulin by two units for all. So to do lantus 22/22 and novolog 27/27/27 and see her back in clinic next week. Pt told if unable to do this, then admission offered to her. Pt amenable to increase in BS checks and insulin. Message sent to pool for clinic visit next week  Cornelia Copa MD Attending [facility] for [facility] (Faculty Practice) 06/02/2017 Time: [phone]

## 2017-06-03 ENCOUNTER — Telehealth: Payer: Self-pay | Admitting: *Deleted

## 2017-06-03 NOTE — Telephone Encounter (Signed)
Received a phone call from [facility] that patient had an elevated blood glucose. Reviewed [facility] and noted patient had elevated fasting glucose this am and that Dr. Vergie Living had called and talked to her yesterday. Reviewed with Dr. Adrian Blackwater and he requested I call and just make sure Decie is following the new insulin orders and checking blood sugar qid as instructed by Dr. Vergie Living yesterday.  I called and left a  Message I am calling to follow up a phone call from yesterday , please call our office today by 12 noon or Monday. Go to mau if any urgent concerns

## 2017-06-06 ENCOUNTER — Ambulatory Visit (INDEPENDENT_AMBULATORY_CARE_PROVIDER_SITE_OTHER): Payer: Medicaid Other | Admitting: Obstetrics & Gynecology

## 2017-06-06 VITALS — BP 123/72 | HR 119 | Wt 216.6 lb

## 2017-06-06 DIAGNOSIS — O0993 Supervision of high risk pregnancy, unspecified, third trimester: Secondary | ICD-10-CM

## 2017-06-06 DIAGNOSIS — O24113 Pre-existing diabetes mellitus, type 2, in pregnancy, third trimester: Secondary | ICD-10-CM

## 2017-06-06 DIAGNOSIS — O0990 Supervision of high risk pregnancy, unspecified, unspecified trimester: Secondary | ICD-10-CM

## 2017-06-06 LAB — POCT URINALYSIS DIP (DEVICE)
Bilirubin Urine: NEGATIVE
GLUCOSE, UA: NEGATIVE mg/dL
Hgb urine dipstick: NEGATIVE
Ketones, ur: 15 mg/dL — AB
Leukocytes, UA: NEGATIVE
Nitrite: NEGATIVE
PH: 6 (ref 5.0–8.0)
PROTEIN: NEGATIVE mg/dL
Specific Gravity, Urine: >=1.03 (ref 1.005–1.030)
UROBILINOGEN UA: 0.2 mg/dL (ref 0.0–1.0)

## 2017-06-06 MED ORDER — INSULIN NPH (HUMAN) (ISOPHANE) 100 UNIT/ML ~~LOC~~ SUSP: SUBCUTANEOUS | 3 refills | Status: DC

## 2017-06-06 NOTE — Patient Instructions (Signed)
Third Trimester of Pregnancy The third trimester is from week 28 through week 40 (months 7 through 9). The third trimester is a time when the unborn baby (fetus) is growing rapidly. At the end of the ninth month, the fetus is about 20 inches in length and weighs 6-10 pounds. Body changes during your third trimester Your body will continue to go through many changes during pregnancy. The changes vary from woman to woman. During the third trimester:  Your weight will continue to increase. You can expect to gain 25-35 pounds (11-16 kg) by the end of the pregnancy.  You may begin to get stretch marks on your hips, abdomen, and breasts.  You may urinate more often because the fetus is moving lower into your pelvis and pressing on your bladder.  You may develop or continue to have heartburn. This is caused by increased hormones that slow down muscles in the digestive tract.  You may develop or continue to have constipation because increased hormones slow digestion and cause the muscles that push waste through your intestines to relax.  You may develop hemorrhoids. These are swollen veins (varicose veins) in the rectum that can itch or be painful.  You may develop swollen, bulging veins (varicose veins) in your legs.  You may have increased body aches in the pelvis, back, or thighs. This is due to weight gain and increased hormones that are relaxing your joints.  You may have changes in your hair. These can include thickening of your hair, rapid growth, and changes in texture. Some women also have hair loss during or after pregnancy, or hair that feels dry or thin. Your hair will most likely return to normal after your baby is born.  Your breasts will continue to grow and they will continue to become tender. A yellow fluid (colostrum) may leak from your breasts. This is the first milk you are producing for your baby.  Your belly button may stick out.  You may notice more swelling in your hands,  face, or ankles.  You may have increased tingling or numbness in your hands, arms, and legs. The skin on your belly may also feel numb.  You may feel short of breath because of your expanding uterus.  You may have more problems sleeping. This can be caused by the size of your belly, increased need to urinate, and an increase in your body's metabolism.  You may notice the fetus "dropping," or moving lower in your abdomen (lightening).  You may have increased vaginal discharge.  You may notice your joints feel loose and you may have pain around your pelvic bone.  What to expect at prenatal visits You will have prenatal exams every 2 weeks until week 36. Then you will have weekly prenatal exams. During a routine prenatal visit:  You will be weighed to make sure you and the baby are growing normally.  Your blood pressure will be taken.  Your abdomen will be measured to track your baby's growth.  The fetal heartbeat will be listened to.  Any test results from the previous visit will be discussed.  You may have a cervical check near your due date to see if your cervix has softened or thinned (effaced).  You will be tested for Group B streptococcus. This happens between 35 and 37 weeks.  Your health care provider may ask you:  What your birth plan is.  How you are feeling.  If you are feeling the baby move.  If you have had   any abnormal symptoms, such as leaking fluid, bleeding, severe headaches, or abdominal cramping.  If you are using any tobacco products, including cigarettes, chewing tobacco, and electronic cigarettes.  If you have any questions.  Other tests or screenings that may be performed during your third trimester include:  Blood tests that check for low iron levels (anemia).  Fetal testing to check the health, activity level, and growth of the fetus. Testing is done if you have certain medical conditions or if there are problems during the  pregnancy.  Nonstress test (NST). This test checks the health of your baby to make sure there are no signs of problems, such as the baby not getting enough oxygen. During this test, a belt is placed around your belly. The baby is made to move, and its heart rate is monitored during movement.  What is false labor? False labor is a condition in which you feel small, irregular tightenings of the muscles in the womb (contractions) that usually go away with rest, changing position, or drinking water. These are called Braxton Hicks contractions. Contractions may last for hours, days, or even weeks before true labor sets in. If contractions come at regular intervals, become more frequent, increase in intensity, or become painful, you should see your health care provider. What are the signs of labor?  Abdominal cramps.  Regular contractions that start at 10 minutes apart and become stronger and more frequent with time.  Contractions that start on the top of the uterus and spread down to the lower abdomen and back.  Increased pelvic pressure and dull back pain.  A watery or bloody mucus discharge that comes from the vagina.  Leaking of amniotic fluid. This is also known as your "water breaking." It could be a slow trickle or a gush. Let your health care provider know if it has a color or strange odor. If you have any of these signs, call your health care provider right away, even if it is before your due date. Follow these instructions at home: Medicines  Follow your health care provider's instructions regarding medicine use. Specific medicines may be either safe or unsafe to take during pregnancy.  Take a prenatal vitamin that contains at least 600 micrograms (mcg) of folic acid.  If you develop constipation, try taking a stool softener if your health care provider approves. Eating and drinking  Eat a balanced diet that includes fresh fruits and vegetables, whole grains, good sources of protein  such as meat, eggs, or tofu, and low-fat dairy. Your health care provider will help you determine the amount of weight gain that is right for you.  Avoid raw meat and uncooked cheese. These carry germs that can cause birth defects in the baby.  If you have low calcium intake from food, talk to your health care provider about whether you should take a daily calcium supplement.  Eat four or five small meals rather than three large meals a day.  Limit foods that are high in fat and processed sugars, such as fried and sweet foods.  To prevent constipation: ? Drink enough fluid to keep your urine clear or pale yellow. ? Eat foods that are high in fiber, such as fresh fruits and vegetables, whole grains, and beans. Activity  Exercise only as directed by your health care provider. Most women can continue their usual exercise routine during pregnancy. Try to exercise for 30 minutes at least 5 days a week. Stop exercising if you experience uterine contractions.  Avoid heavy   lifting.  Do not exercise in extreme heat or humidity, or at high altitudes.  Wear low-heel, comfortable shoes.  Practice good posture.  You may continue to have sex unless your health care provider tells you otherwise. Relieving pain and discomfort  Take frequent breaks and rest with your legs elevated if you have leg cramps or low back pain.  Take warm sitz baths to soothe any pain or discomfort caused by hemorrhoids. Use hemorrhoid cream if your health care provider approves.  Wear a good support bra to prevent discomfort from breast tenderness.  If you develop varicose veins: ? Wear support pantyhose or compression stockings as told by your healthcare provider. ? Elevate your feet for 15 minutes, 3-4 times a day. Prenatal care  Write down your questions. Take them to your prenatal visits.  Keep all your prenatal visits as told by your health care provider. This is important. Safety  Wear your seat belt at  all times when driving.  Make a list of emergency phone numbers, including numbers for family, friends, the hospital, and police and fire departments. General instructions  Avoid cat litter boxes and soil used by cats. These carry germs that can cause birth defects in the baby. If you have a cat, ask someone to clean the litter box for you.  Do not travel far distances unless it is absolutely necessary and only with the approval of your health care provider.  Do not use hot tubs, steam rooms, or saunas.  Do not drink alcohol.  Do not use any products that contain nicotine or tobacco, such as cigarettes and e-cigarettes. If you need help quitting, ask your health care provider.  Do not use any medicinal herbs or unprescribed drugs. These chemicals affect the formation and growth of the baby.  Do not douche or use tampons or scented sanitary pads.  Do not cross your legs for long periods of time.  To prepare for the arrival of your baby: ? Take prenatal classes to understand, practice, and ask questions about labor and delivery. ? Make a trial run to the hospital. ? Visit the hospital and tour the maternity area. ? Arrange for maternity or paternity leave through employers. ? Arrange for family and friends to take care of pets while you are in the hospital. ? Purchase a rear-facing car seat and make sure you know how to install it in your car. ? Pack your hospital bag. ? Prepare the baby's nursery. Make sure to remove all pillows and stuffed animals from the baby's crib to prevent suffocation.  Visit your dentist if you have not gone during your pregnancy. Use a soft toothbrush to brush your teeth and be gentle when you floss. Contact a health care provider if:  You are unsure if you are in labor or if your water has broken.  You become dizzy.  You have mild pelvic cramps, pelvic pressure, or nagging pain in your abdominal area.  You have lower back pain.  You have persistent  nausea, vomiting, or diarrhea.  You have an unusual or bad smelling vaginal discharge.  You have pain when you urinate. Get help right away if:  Your water breaks before 37 weeks.  You have regular contractions less than 5 minutes apart before 37 weeks.  You have a fever.  You are leaking fluid from your vagina.  You have spotting or bleeding from your vagina.  You have severe abdominal pain or cramping.  You have rapid weight loss or weight gain.    You have shortness of breath with chest pain.  You notice sudden or extreme swelling of your face, hands, ankles, feet, or legs.  Your baby makes fewer than 10 movements in 2 hours.  You have severe headaches that do not go away when you take medicine.  You have vision changes. Summary  The third trimester is from week 28 through week 40, months 7 through 9. The third trimester is a time when the unborn baby (fetus) is growing rapidly.  During the third trimester, your discomfort may increase as you and your baby continue to gain weight. You may have abdominal, leg, and back pain, sleeping problems, and an increased need to urinate.  During the third trimester your breasts will keep growing and they will continue to become tender. A yellow fluid (colostrum) may leak from your breasts. This is the first milk you are producing for your baby.  False labor is a condition in which you feel small, irregular tightenings of the muscles in the womb (contractions) that eventually go away. These are called Braxton Hicks contractions. Contractions may last for hours, days, or even weeks before true labor sets in.  Signs of labor can include: abdominal cramps; regular contractions that start at 10 minutes apart and become stronger and more frequent with time; watery or bloody mucus discharge that comes from the vagina; increased pelvic pressure and dull back pain; and leaking of amniotic fluid. This information is not intended to replace advice  given to you by your health care provider. Make sure you discuss any questions you have with your health care provider. Document Released: 08/24/2001 Document Revised: 02/05/2016 Document Reviewed: 10/31/2012 Elsevier Interactive Patient Education  2017 [employer]  

## 2017-06-06 NOTE — Progress Notes (Signed)
FBS still >200   PRENATAL VISIT NOTE  Subjective:  Alison Rowe is a 31 y.o. G2P1001 at [redacted]w[redacted]d being seen today for ongoing prenatal care.  She is currently monitored for the following issues for this high-risk pregnancy and has Supervision of high risk pregnancy, antepartum; Type 2 diabetes mellitus in pregnancy; and History of cesarean section complicating pregnancy on her problem list.  Patient reports bruises at injection sites.  Contractions: Irregular. Vag. Bleeding: None.  Movement: Present. Denies leaking of fluid.   The following portions of the patient's history were reviewed and updated as appropriate: allergies, current medications, past family history, past medical history, past social history, past surgical history and problem list. Problem list updated.  Objective:   Vitals:   06/06/17 1128  BP: 123/72  Pulse: (!) 119  Weight: 98.2 kg (216 lb 9.6 oz)    Fetal Status: Fetal Heart Rate (bpm): 140   Movement: Present     General:  Alert, oriented and cooperative. Patient is in no acute distress.  Skin: Skin is warm and dry. No rash noted.   Cardiovascular: Normal heart rate noted  Respiratory: Normal respiratory effort, no problems with respiration noted  Abdomen: Soft, gravid, appropriate for gestational age.  Pain/Pressure: Present     Pelvic: Cervical exam deferred        Extremities: Normal range of motion.  Edema: Trace  Mental Status:  Normal mood and affect. Normal behavior. Normal judgment and thought content.   Assessment and Plan:  Pregnancy: G2P1001 at [redacted]w[redacted]d  1. Supervision of high risk pregnancy, antepartum Change from Lantus to Novalin - insulin NPH Human (NOVOLIN N) 100 UNIT/ML injection; 28 units [place] at breakfast and at bedtime  Dispense: 10 mL; Refill: 3  2. Pregnancy with type 2 diabetes mellitus in third trimester Stressed importance of compliance checking BG QID  Preterm labor symptoms and general obstetric precautions including but not limited  to vaginal bleeding, contractions, leaking of fluid and fetal movement were reviewed in detail with the patient. Please refer to After Visit Summary for other counseling recommendations.  Return in about 8 days (around 06/14/2017).   Scheryl Darter, MD

## 2017-06-06 NOTE — Telephone Encounter (Addendum)
Received another call from [facility] re: another elevated blood glucose from 06/05/17 am.  Discussed with Dr. Debroah Loop and we reviewed her chart.Has elevated fasting glucose and others.  Alison Rowe and she is checking her blood sugar qid as instructed by Dr. Vergie Living and is taking novolog 27 units each meal, and lantus 25 units bid as instructed.  Per chart should be coming in this week to review blood sugars and regimen with a provider - agreed to come in today at 31.

## 2017-06-07 ENCOUNTER — Ambulatory Visit (HOSPITAL_COMMUNITY)
Admission: RE | Admit: 2017-06-07 | Discharge: 2017-06-07 | Disposition: A | Discharge status: Home / Self Care | Payer: Medicaid Other | Source: Ambulatory Visit | Attending: Obstetrics & Gynecology | Admitting: Obstetrics & Gynecology

## 2017-06-07 ENCOUNTER — Other Ambulatory Visit (HOSPITAL_COMMUNITY): Payer: Self-pay | Admitting: *Deleted

## 2017-06-07 ENCOUNTER — Encounter (HOSPITAL_COMMUNITY): Payer: Self-pay

## 2017-06-07 ENCOUNTER — Other Ambulatory Visit (HOSPITAL_COMMUNITY): Payer: Self-pay | Admitting: Maternal & Fetal Medicine

## 2017-06-07 DIAGNOSIS — Z3A29 29 weeks gestation of pregnancy: Secondary | ICD-10-CM | POA: Diagnosis not present

## 2017-06-07 DIAGNOSIS — O24113 Pre-existing diabetes mellitus, type 2, in pregnancy, third trimester: Secondary | ICD-10-CM | POA: Diagnosis not present

## 2017-06-07 DIAGNOSIS — O24119 Pre-existing diabetes mellitus, type 2, in pregnancy, unspecified trimester: Secondary | ICD-10-CM

## 2017-06-07 DIAGNOSIS — O34219 Maternal care for unspecified type scar from previous cesarean delivery: Secondary | ICD-10-CM

## 2017-06-07 DIAGNOSIS — Z794 Long term (current) use of insulin: Principal | ICD-10-CM

## 2017-06-07 DIAGNOSIS — O99213 Obesity complicating pregnancy, third trimester: Secondary | ICD-10-CM

## 2017-06-07 DIAGNOSIS — O34211 Maternal care for low transverse scar from previous cesarean delivery: Secondary | ICD-10-CM | POA: Insufficient documentation

## 2017-06-08 ENCOUNTER — Encounter: Payer: Self-pay | Admitting: General Practice

## 2017-06-08 NOTE — Progress Notes (Unsigned)
Received phone call and email on patient for blood sugar triggers. Will forward to Dr Penne Lash for review.

## 2017-06-13 ENCOUNTER — Encounter: Payer: Self-pay | Admitting: *Deleted

## 2017-06-13 NOTE — Progress Notes (Unsigned)
Email received regarding blood glucose triggers. Will forward to Dr Penne Lash for review.

## 2017-06-14 ENCOUNTER — Ambulatory Visit (INDEPENDENT_AMBULATORY_CARE_PROVIDER_SITE_OTHER): Payer: Medicaid Other | Admitting: Obstetrics and Gynecology

## 2017-06-14 VITALS — BP 117/68 | HR 107 | Wt 214.5 lb

## 2017-06-14 DIAGNOSIS — O0990 Supervision of high risk pregnancy, unspecified, unspecified trimester: Secondary | ICD-10-CM

## 2017-06-14 DIAGNOSIS — O0993 Supervision of high risk pregnancy, unspecified, third trimester: Secondary | ICD-10-CM

## 2017-06-14 DIAGNOSIS — O24113 Pre-existing diabetes mellitus, type 2, in pregnancy, third trimester: Secondary | ICD-10-CM

## 2017-06-14 DIAGNOSIS — O34219 Maternal care for unspecified type scar from previous cesarean delivery: Secondary | ICD-10-CM

## 2017-06-14 MED ORDER — INSULIN ASPART 100 UNIT/ML FLEXPEN: PEN_INJECTOR | SUBCUTANEOUS | 11 refills | Status: DC

## 2017-06-14 MED ORDER — INSULIN NPH (HUMAN) (ISOPHANE) 100 UNIT/ML ~~LOC~~ SUSP: SUBCUTANEOUS | 3 refills | Status: DC

## 2017-06-14 NOTE — Progress Notes (Signed)
Prenatal Visit Note Date: 06/14/2017 Clinic: [facility]  Subjective:  Alison Rowe is a 31 y.o. G2P1001 at [redacted]w[redacted]d being seen today for ongoing prenatal care.  She is currently monitored for the following issues for this high-risk pregnancy and has Supervision of high risk pregnancy, antepartum; Type 2 diabetes mellitus in pregnancy; and History of cesarean section complicating pregnancy on her problem list.  Patient reports no complaints.   Contractions: Irregular. Vag. Bleeding: None.  Movement: Present. Denies leaking of fluid.   The following portions of the patient's history were reviewed and updated as appropriate: allergies, current medications, past family history, past medical history, past social history, past surgical history and problem list. Problem list updated.  Objective:   Vitals:   06/14/17 1259  BP: 117/68  Pulse: (!) 107  Weight: 214 lb 8 oz (97.3 kg)    Fetal Status: Fetal Heart Rate (bpm): 140   Movement: Present     General:  Alert, oriented and cooperative. Patient is in no acute distress.  Skin: Skin is warm and dry. No rash noted.   Cardiovascular: Normal heart rate noted  Respiratory: Normal respiratory effort, no problems with respiration noted  Abdomen: Soft, gravid, appropriate for gestational age. Pain/Pressure: Present     Pelvic:  Cervical exam deferred        Extremities: Normal range of motion.  Edema: None  Mental Status: Normal mood and affect. Normal behavior. Normal judgment and thought content.   Urinalysis:      Assessment and Plan:  Pregnancy: G2P1001 at [redacted]w[redacted]d  1. History of cesarean section complicating pregnancy Op note not back yet but I told her that based on what she told me last visit that she's a tolac candidate. Pt would like to tolac. Can d/w pt more nv and have her sign tolac consent  2. Supervision of high risk pregnancy, antepartum BPP, growth for next week and pt told that will start ap testing qwk  starting from then. D/w pt re: BC more at subsequent visits.   3. Pregnancy with type 2 diabetes mellitus in third trimester Pt currently on metformin 1000/1000 and nph 30/30 and novolog 30/30/30. Elevated AM fastings in the 100s-120s and 2hr PP are mostly normal with a few labile ones above 150. I told her to keep everything the same exept to increase the qhs nph to 34. - [place] MFM FETAL BPP WO NON STRESS; Future  Preterm labor symptoms and general obstetric precautions including but not limited to vaginal bleeding, contractions, leaking of fluid and fetal movement were reviewed in detail with the patient. Please refer to After Visit Summary for other counseling recommendations.  Return in about 1 week (around 06/21/2017) for nst only visit. 2wk rob, nst/bpp visit.   Moraga Bing, MD

## 2017-06-14 NOTE — Progress Notes (Signed)
Pt was seen on October 2nd by Dr. Vergie Living.  Elevated fasting CBGs were addressed by incresing NPH nightly to 34 units.

## 2017-06-20 ENCOUNTER — Encounter: Payer: Self-pay | Admitting: General Practice

## 2017-06-20 NOTE — Progress Notes (Unsigned)
Patient triggered in babyscripts over the weekend. Per chart review, patient sporadically checks blood sugars and has appt for NST only tomorrow. Next office visit is 10/16. Will forward to Dr Penne Lash.

## 2017-06-21 ENCOUNTER — Ambulatory Visit (INDEPENDENT_AMBULATORY_CARE_PROVIDER_SITE_OTHER): Payer: Medicaid Other | Admitting: *Deleted

## 2017-06-21 ENCOUNTER — Ambulatory Visit (HOSPITAL_COMMUNITY)
Admission: RE | Admit: 2017-06-21 | Discharge: 2017-06-21 | Disposition: A | Discharge status: Home / Self Care | Payer: Medicaid Other | Source: Ambulatory Visit | Attending: Obstetrics & Gynecology | Admitting: Obstetrics & Gynecology

## 2017-06-21 ENCOUNTER — Other Ambulatory Visit (HOSPITAL_COMMUNITY): Payer: Self-pay | Admitting: *Deleted

## 2017-06-21 VITALS — BP 105/60 | HR 114

## 2017-06-21 DIAGNOSIS — Z3A31 31 weeks gestation of pregnancy: Secondary | ICD-10-CM | POA: Insufficient documentation

## 2017-06-21 DIAGNOSIS — O24113 Pre-existing diabetes mellitus, type 2, in pregnancy, third trimester: Secondary | ICD-10-CM | POA: Diagnosis not present

## 2017-06-21 DIAGNOSIS — O34211 Maternal care for low transverse scar from previous cesarean delivery: Secondary | ICD-10-CM | POA: Diagnosis not present

## 2017-06-21 DIAGNOSIS — Z794 Long term (current) use of insulin: Secondary | ICD-10-CM

## 2017-06-21 DIAGNOSIS — O0990 Supervision of high risk pregnancy, unspecified, unspecified trimester: Secondary | ICD-10-CM

## 2017-06-21 DIAGNOSIS — O99213 Obesity complicating pregnancy, third trimester: Secondary | ICD-10-CM | POA: Insufficient documentation

## 2017-06-21 DIAGNOSIS — O24119 Pre-existing diabetes mellitus, type 2, in pregnancy, unspecified trimester: Secondary | ICD-10-CM

## 2017-06-21 NOTE — Progress Notes (Addendum)
Agree with nursing staff's documentation of this patient's clinic encounter.  NST reviewed.  Fetal Monitoring: Baseline: 125 bpm Variability: moderate Accelerations: 10 x 10, 15 x 15 Decelerations: none Contractions: irregular with moderate UI  Patient does not palpate contractions per RN. Cervical exam deferred.   Vonzella Nipple, PA-C  06/21/2017 11:13 AM

## 2017-06-21 NOTE — Progress Notes (Addendum)
Pt beginning weekly fetal testing regimen today.  [place] for growth and BPP scheduled today @ [facility]. Pt is not aware of UC's during NST. NST results discussed w/Alison Magnus Sinning, PA.   I also discussed CBG testing w/pt today due to triggers in Babyscripts. She states she is testing 3 times daily because she only eats 2 meals per Alison Rowe due to not feeling hungry (breakfast @ 0830 and dinner @ 7-9pm)  She has a snack @ 1pm and 5pm.

## 2017-06-22 ENCOUNTER — Encounter: Payer: Self-pay | Admitting: Obstetrics and Gynecology

## 2017-06-22 DIAGNOSIS — O3660X0 Maternal care for excessive fetal growth, unspecified trimester, not applicable or unspecified: Secondary | ICD-10-CM | POA: Insufficient documentation

## 2017-06-23 ENCOUNTER — Encounter: Payer: Self-pay | Admitting: *Deleted

## 2017-06-28 ENCOUNTER — Other Ambulatory Visit (HOSPITAL_COMMUNITY)
Admission: RE | Admit: 2017-06-28 | Discharge: 2017-06-28 | Disposition: A | Discharge status: Home / Self Care | Payer: Medicaid Other | Source: Ambulatory Visit | Attending: Advanced Practice Midwife | Admitting: Advanced Practice Midwife

## 2017-06-28 ENCOUNTER — Ambulatory Visit: Payer: Self-pay

## 2017-06-28 ENCOUNTER — Ambulatory Visit (INDEPENDENT_AMBULATORY_CARE_PROVIDER_SITE_OTHER): Payer: Medicaid Other | Admitting: *Deleted

## 2017-06-28 ENCOUNTER — Ambulatory Visit (INDEPENDENT_AMBULATORY_CARE_PROVIDER_SITE_OTHER): Payer: Medicaid Other | Admitting: Advanced Practice Midwife

## 2017-06-28 VITALS — BP 80/51 | HR 120 | Wt 219.9 lb

## 2017-06-28 DIAGNOSIS — O0993 Supervision of high risk pregnancy, unspecified, third trimester: Secondary | ICD-10-CM | POA: Diagnosis present

## 2017-06-28 DIAGNOSIS — O24113 Pre-existing diabetes mellitus, type 2, in pregnancy, third trimester: Secondary | ICD-10-CM | POA: Diagnosis not present

## 2017-06-28 DIAGNOSIS — N898 Other specified noninflammatory disorders of vagina: Secondary | ICD-10-CM | POA: Diagnosis not present

## 2017-06-28 DIAGNOSIS — F419 Anxiety disorder, unspecified: Secondary | ICD-10-CM | POA: Diagnosis not present

## 2017-06-28 DIAGNOSIS — O0990 Supervision of high risk pregnancy, unspecified, unspecified trimester: Secondary | ICD-10-CM

## 2017-06-28 DIAGNOSIS — O99343 Other mental disorders complicating pregnancy, third trimester: Secondary | ICD-10-CM | POA: Diagnosis not present

## 2017-06-28 MED ORDER — METFORMIN HCL 1000 MG PO TABS: 1000.0000 mg | ORAL_TABLET | Freq: Two times a day (BID) | ORAL | 3 refills | Status: DC

## 2017-06-28 MED ORDER — HYDROXYZINE HCL 50 MG PO TABS: 50.0000 mg | ORAL_TABLET | Freq: Four times a day (QID) | ORAL | 2 refills | Status: DC | PRN

## 2017-06-28 MED ORDER — INSULIN NPH (HUMAN) (ISOPHANE) 100 UNIT/ML ~~LOC~~ SUSP: SUBCUTANEOUS | 3 refills | Status: DC

## 2017-06-28 NOTE — Progress Notes (Signed)

## 2017-06-28 NOTE — Progress Notes (Signed)
Pt reports having odor and wetness in panties x3 days.  [place] for growth scheduled on 11/6, BPP added. Pt requests refills for Metformin and Hydroxyzine. Pt stopped taking ASA 81 mg temporarily due to having tooth extraction last week.

## 2017-06-29 LAB — CERVICOVAGINAL ANCILLARY ONLY
Bacterial vaginitis: NEGATIVE
CANDIDA VAGINITIS: NEGATIVE
Chlamydia: NEGATIVE
Neisseria Gonorrhea: NEGATIVE
TRICH (WINDOWPATH): NEGATIVE

## 2017-06-29 NOTE — Progress Notes (Signed)
   PRENATAL VISIT NOTE  Subjective:  Alison Rowe is a 31 y.o. G2P1001 at 6854w6d being seen today for ongoing prenatal care.  She is currently monitored for the following issues for this high-risk pregnancy and has Supervision of high risk pregnancy, antepartum; Type 2 diabetes mellitus in pregnancy; History of cesarean section complicating pregnancy; and LGA (large for gestational age) fetus affecting management of mother on her problem list.  Patient reports occasional contractions and feeling dampness inunderwear and having vaginal odor x 3 days. .  Contractions: Irregular. Vag. Bleeding: None.  Movement: Present. Denies leaking of fluid.   The following portions of the patient's history were reviewed and updated as appropriate: allergies, current medications, past family history, past medical history, past social history, past surgical history and problem list. Problem list updated.  Objective:   Vitals:   06/28/17 0934  BP: (!) 80/51  Pulse: (!) 120  Weight: 219 lb 14.4 oz (99.7 kg)    Fetal Status: Fetal Heart Rate (bpm): NST Fundal Height: 33 cm Movement: Present    NST 140, moderate variability, 10x10 accels, no decels, Toco: Q3-4 min, mild.  Neg CST  BPP 8/10  General:  Alert, oriented and cooperative. Patient is in no acute distress.  Skin: Skin is warm and dry. No rash noted.   Cardiovascular: Normal heart rate noted  Respiratory: Normal respiratory effort, no problems with respiration noted  Abdomen: Soft, gravid, appropriate for gestational age.  Pain/Pressure: Present     Pelvic: Sterile Spec exam performed; Neg pooling. Small amount of thick, white, mildly malodorous discharge. No LOF w/ valsalva. Dilation: Closed Effacement (%): 0 Station: Ballotable  Extremities: Normal range of motion.  Edema: Trace  Mental Status:  Normal mood and affect. Normal behavior. Normal judgment and thought content.   Neg Fern  Fast CBG's ~96-105 Post prandials WNL  Assessment and  Plan:  Pregnancy: G2P1001 at 1633w0d  1. Supervision of high risk pregnancy, antepartum   2. Pregnancy with type 2 diabetes mellitus in third trimester  - US MFM FETAL BPP WO NON STRESS; Future - metFORMIN (GLUCOPHAGE) 1000 MG tablet; Take 1 tablet (1,000 mg total) by mouth 2 (two) times daily.  Dispense: 60 tablet; Refill: 3 - Increase HS NPH to 36 units  3. Anxiety during pregnancy in third trimester, antepartum  - hydrOXYzine (ATARAX/VISTARIL) 50 MG tablet; Take 1 tablet (50 mg total) by mouth every 6 (six) hours as needed for anxiety (insomnia).  Dispense: 30 tablet; Refill: 2  4. Vaginal discharge w/out evidence of ROM  - Cervicovaginal ancillary only  Preterm labor symptoms and general obstetric precautions including but not limited to vaginal bleeding, contractions, leaking of fluid and fetal movement were reviewed in detail with the patient. Please refer to After Visit Summary for other counseling recommendations.  Return in about 7 days (around 07/05/2017) for NST/BPP; 10/30 NST/BPP & HOB; 11/6 NST only - has US @ 0830.   Dorathy KinsmanVirginia Meria Crilly, CNM

## 2017-07-05 ENCOUNTER — Ambulatory Visit: Payer: Self-pay

## 2017-07-05 ENCOUNTER — Ambulatory Visit (INDEPENDENT_AMBULATORY_CARE_PROVIDER_SITE_OTHER): Payer: Medicaid Other | Admitting: Family Medicine

## 2017-07-05 ENCOUNTER — Ambulatory Visit: Payer: Medicaid Other | Admitting: *Deleted

## 2017-07-05 VITALS — BP 107/71 | HR 96 | Wt 222.7 lb

## 2017-07-05 DIAGNOSIS — O0990 Supervision of high risk pregnancy, unspecified, unspecified trimester: Secondary | ICD-10-CM

## 2017-07-05 DIAGNOSIS — O24113 Pre-existing diabetes mellitus, type 2, in pregnancy, third trimester: Secondary | ICD-10-CM

## 2017-07-05 DIAGNOSIS — O0993 Supervision of high risk pregnancy, unspecified, third trimester: Secondary | ICD-10-CM

## 2017-07-05 LAB — POCT URINALYSIS DIP (DEVICE)
Bilirubin Urine: NEGATIVE
GLUCOSE, UA: NEGATIVE mg/dL
HGB URINE DIPSTICK: NEGATIVE
KETONES UR: NEGATIVE mg/dL
Leukocytes, UA: NEGATIVE
Nitrite: NEGATIVE
PROTEIN: NEGATIVE mg/dL
SPECIFIC GRAVITY, URINE: 1.025 (ref 1.005–1.030)
Urobilinogen, UA: 0.2 mg/dL (ref 0.0–1.0)
pH: 6 (ref 5.0–8.0)

## 2017-07-05 NOTE — Progress Notes (Signed)
   PRENATAL VISIT NOTE  Subjective:  Alison Rowe is a 31 y.o. G2P1001 at 3240w6d being seen today for ongoing prenatal care.  She is currently monitored for the following issues for this high-risk pregnancy and has Supervision of high risk pregnancy, antepartum; Type 2 diabetes mellitus in pregnancy; History of cesarean section complicating pregnancy; and LGA (large for gestational age) fetus affecting management of mother on her problem list.  Patient reports no complaints.  Contractions: Irregular. Vag. Bleeding: None.  Movement: Present. Denies leaking of fluid.   The following portions of the patient's history were reviewed and updated as appropriate: allergies, current medications, past family history, past medical history, past social history, past surgical history and problem list. Problem list updated.  Objective:   Vitals:   07/05/17 0805  BP: 107/71  Pulse: 96  Weight: 222 lb 11.2 oz (101 kg)    Fetal Status: Fetal Heart Rate (bpm): NST   Movement: Present     General:  Alert, oriented and cooperative. Patient is in no acute distress.  Skin: Skin is warm and dry. No rash noted.   Cardiovascular: Normal heart rate noted  Respiratory: Normal respiratory effort, no problems with respiration noted  Abdomen: Soft, gravid, appropriate for gestational age.  Pain/Pressure: Present     Pelvic: Cervical exam deferred        Extremities: Normal range of motion.     Mental Status:  Normal mood and affect. Normal behavior. Normal judgment and thought content.   Assessment and Plan:  Pregnancy: G2P1001 at 3740w6d  1. Supervision of high risk pregnancy, antepartum Continue antenatal testing TOLAC consent signed today  2. Pregnancy with type 2 diabetes mellitus in third trimester Fasting cbgs 120 and postprandial wnl- increase insulin at bedtime to 38 Units. If patient needs 40 Units at next visit, I would give half dose qhs and half dose qam to improve absorption. Continue  metformin.  Preterm labor symptoms and general obstetric precautions including but not limited to vaginal bleeding, contractions, leaking of fluid and fetal movement were reviewed in detail with the patient. Please refer to After Visit Summary for other counseling recommendations.  Return in about 7 days (around 07/12/2017) for NST/BPP only;  11/6  NST & HOB - has US @ 0830.   Rolm BookbinderAmber Tzivia Oneil, DO

## 2017-07-05 NOTE — Progress Notes (Signed)

## 2017-07-12 ENCOUNTER — Ambulatory Visit (INDEPENDENT_AMBULATORY_CARE_PROVIDER_SITE_OTHER): Payer: Medicaid Other | Admitting: *Deleted

## 2017-07-12 ENCOUNTER — Ambulatory Visit: Payer: Self-pay

## 2017-07-12 ENCOUNTER — Ambulatory Visit: Payer: Medicaid Other | Admitting: Obstetrics and Gynecology

## 2017-07-12 VITALS — BP 117/73 | HR 107 | Wt 229.1 lb

## 2017-07-12 DIAGNOSIS — O24113 Pre-existing diabetes mellitus, type 2, in pregnancy, third trimester: Secondary | ICD-10-CM

## 2017-07-12 DIAGNOSIS — O0990 Supervision of high risk pregnancy, unspecified, unspecified trimester: Secondary | ICD-10-CM

## 2017-07-12 DIAGNOSIS — O34219 Maternal care for unspecified type scar from previous cesarean delivery: Secondary | ICD-10-CM

## 2017-07-12 NOTE — Patient Instructions (Signed)
LEG CRAMPS -- Leg cramps are common, usually occurring during the latter half of pregnancy. The cramps are due to painful muscle contractions and are generally experienced in the calves at night. They are thought to be secondary to a buildup of lactic and pyruvic acids leading to involuntary contraction of the affected muscles, but the exact etiology is unknown.  A Cochrane review found that the only placebo-controlled trial of calcium treatment showed no evidence of benefit in the treatment of leg cramps, but placebo controlled trials of magnesium supplementation suggested a possible benefit (odds ratio 0.18, 95 percent CI 0.05 to 0.60). This was a small trial of 69 pregnant women with persistent leg cramps. The preparation used was magnesium lactate or citrate 5 mmol in the morning and 10 mmol in the evening.    Stretching exercises may be an effective preventive measure. These can be performed in the weight-bearing position; they are held for 20 seconds and repeated three times in succession, four times daily for one week, then twice daily thereafter.  If a cramp occurs, calf stretches (toe raises), walking, or leg jiggling followed by leg elevation may be helpful. Other nonpharmacologic remedies include: ?A hot shower or warm tub bath ?Ice massage ?Regular exercise for conditioning, calf strengthening and stretching ?Increased hydration ?Use of long-countered shoes and other proper foot gear.   

## 2017-07-12 NOTE — Progress Notes (Signed)
   PRENATAL VISIT NOTE  Subjective:  Alison Rowe is a 31 y.o. G2P1001 at 7913w6d being seen today for ongoing prenatal care.  She is currently monitored for the following issues for this high-risk pregnancy and has Supervision of high risk pregnancy, antepartum; Type 2 diabetes mellitus in pregnancy; History of cesarean section complicating pregnancy; and LGA (large for gestational age) fetus affecting management of mother on her problem list.  Patient reports pain in both legs; pain worsens when she up walking around to much. Not wearing ted hose. .  Contractions: Irregular. Vag. Bleeding: None.  Movement: Present. Denies leaking of fluid.   The following portions of the patient's history were reviewed and updated as appropriate: allergies, current medications, past family history, past medical history, past social history, past surgical history and problem list. Problem list updated.  Objective:   Vitals:   07/12/17 0814  BP: 117/73  Pulse: (!) 107  Weight: 229 lb 1.6 oz (103.9 kg)    Fetal Status: Fetal Heart Rate (bpm): NST   Movement: Present     General:  Alert, oriented and cooperative. Patient is in no acute distress.  Skin: Skin is warm and dry. No rash noted.   Cardiovascular: Normal heart rate noted  Respiratory: Normal respiratory effort, no problems with respiration noted  Abdomen: Soft, gravid, appropriate for gestational age.  Pain/Pressure: Present     Pelvic: Cervical exam deferred        Extremities: Normal range of motion.  Edema: Mild pitting, slight indentation  Mental Status:  Normal mood and affect. Normal behavior. Normal judgment and thought content.   Assessment and Plan:  Pregnancy: G2P1001 at 2013w6d  1. Supervision of high risk pregnancy, antepartum  - BPP 8/8 today.  - reactive NST, + accels, no decels, baseline 135 bpm  2. Pregnancy with type 2 diabetes mellitus in third trimester  - Did not bring log today - She reports one abnormal fasting of  98. Range is from 78-90 - She reports post prandial of 107-120; She had one elevated at 132 after eating fast food.  3. History of cesarean section complicating pregnancy  - TOLAC signed   Lengthy discussion about importance of testing blood sugars, bringing log book and keeping appointments. Discussed risks of uncontrolled DM in pregnancy including delayed fetal lung maturity, IUFD and shoulder dystocia possibly resulting brachial plexus palsy, brain damage, intrapartum death and extensive obstetric lacerations. Patient verbalizes understanding.  Preterm labor symptoms and general obstetric precautions including but not limited to vaginal bleeding, contractions, leaking of fluid and fetal movement were reviewed in detail with the patient. Please refer to After Visit Summary for other counseling recommendations.  Return in about 7 days (around 07/19/2017) for as scheduled; 11/13  NST/BPP and HOB; 11/20  NST/BPP and HOB.   Venia CarbonJennifer Anjelique Makar, NP

## 2017-07-12 NOTE — Progress Notes (Signed)
Pt reports the following: pain in front of her lower legs which she describes as "tibia" pain, hemorrhoid with bleeding, increased swelling of feet and some strong UC's at night which wake her up (less than 4/hr) and her face is breaking out.  US for growth and BPP scheduled on 11/6.

## 2017-07-12 NOTE — Progress Notes (Signed)

## 2017-07-19 ENCOUNTER — Encounter: Payer: Self-pay | Admitting: Obstetrics and Gynecology

## 2017-07-19 ENCOUNTER — Ambulatory Visit (HOSPITAL_COMMUNITY)
Admission: RE | Admit: 2017-07-19 | Discharge: 2017-07-19 | Disposition: A | Discharge status: Home / Self Care | Payer: Medicaid Other | Source: Ambulatory Visit | Attending: Obstetrics & Gynecology | Admitting: Obstetrics & Gynecology

## 2017-07-19 ENCOUNTER — Other Ambulatory Visit (HOSPITAL_COMMUNITY)
Admission: RE | Admit: 2017-07-19 | Discharge: 2017-07-19 | Disposition: A | Discharge status: Home / Self Care | Payer: Medicaid Other | Source: Ambulatory Visit | Attending: Obstetrics and Gynecology | Admitting: Obstetrics and Gynecology

## 2017-07-19 ENCOUNTER — Other Ambulatory Visit (HOSPITAL_COMMUNITY): Payer: Self-pay | Admitting: Maternal and Fetal Medicine

## 2017-07-19 ENCOUNTER — Ambulatory Visit (INDEPENDENT_AMBULATORY_CARE_PROVIDER_SITE_OTHER): Payer: Medicaid Other | Admitting: *Deleted

## 2017-07-19 ENCOUNTER — Ambulatory Visit (INDEPENDENT_AMBULATORY_CARE_PROVIDER_SITE_OTHER): Payer: Medicaid Other | Admitting: Obstetrics and Gynecology

## 2017-07-19 ENCOUNTER — Encounter (HOSPITAL_COMMUNITY): Payer: Self-pay

## 2017-07-19 ENCOUNTER — Inpatient Hospital Stay (HOSPITAL_COMMUNITY)
Admission: AD | Admit: 2017-07-19 | Discharge: 2017-07-19 | Disposition: A | Discharge status: Home / Self Care | Payer: Medicaid Other | Source: Ambulatory Visit | Attending: Family Medicine | Admitting: Family Medicine

## 2017-07-19 VITALS — BP 105/67 | HR 103 | Wt 228.0 lb

## 2017-07-19 DIAGNOSIS — O34219 Maternal care for unspecified type scar from previous cesarean delivery: Secondary | ICD-10-CM

## 2017-07-19 DIAGNOSIS — O26853 Spotting complicating pregnancy, third trimester: Secondary | ICD-10-CM | POA: Diagnosis not present

## 2017-07-19 DIAGNOSIS — O24113 Pre-existing diabetes mellitus, type 2, in pregnancy, third trimester: Secondary | ICD-10-CM | POA: Insufficient documentation

## 2017-07-19 DIAGNOSIS — Z3A35 35 weeks gestation of pregnancy: Secondary | ICD-10-CM | POA: Insufficient documentation

## 2017-07-19 DIAGNOSIS — Z98891 History of uterine scar from previous surgery: Secondary | ICD-10-CM

## 2017-07-19 DIAGNOSIS — O0993 Supervision of high risk pregnancy, unspecified, third trimester: Secondary | ICD-10-CM | POA: Diagnosis present

## 2017-07-19 DIAGNOSIS — O0990 Supervision of high risk pregnancy, unspecified, unspecified trimester: Secondary | ICD-10-CM

## 2017-07-19 DIAGNOSIS — O24414 Gestational diabetes mellitus in pregnancy, insulin controlled: Secondary | ICD-10-CM | POA: Diagnosis not present

## 2017-07-19 DIAGNOSIS — O4703 False labor before 37 completed weeks of gestation, third trimester: Secondary | ICD-10-CM | POA: Diagnosis present

## 2017-07-19 DIAGNOSIS — Z113 Encounter for screening for infections with a predominantly sexual mode of transmission: Secondary | ICD-10-CM

## 2017-07-19 DIAGNOSIS — N939 Abnormal uterine and vaginal bleeding, unspecified: Secondary | ICD-10-CM

## 2017-07-19 DIAGNOSIS — O479 False labor, unspecified: Secondary | ICD-10-CM

## 2017-07-19 DIAGNOSIS — Z3689 Encounter for other specified antenatal screening: Secondary | ICD-10-CM

## 2017-07-19 DIAGNOSIS — O24119 Pre-existing diabetes mellitus, type 2, in pregnancy, unspecified trimester: Secondary | ICD-10-CM

## 2017-07-19 LAB — URINALYSIS, ROUTINE W REFLEX MICROSCOPIC
BILIRUBIN URINE: NEGATIVE
Bacteria, UA: NONE SEEN
GLUCOSE, UA: NEGATIVE mg/dL
KETONES UR: NEGATIVE mg/dL
LEUKOCYTES UA: NEGATIVE
NITRITE: NEGATIVE
PROTEIN: 30 mg/dL — AB
Specific Gravity, Urine: 1.021 (ref 1.005–1.030)
pH: 6 (ref 5.0–8.0)

## 2017-07-19 LAB — WET PREP, GENITAL
CLUE CELLS WET PREP: NONE SEEN
Sperm: NONE SEEN
TRICH WET PREP: NONE SEEN
Yeast Wet Prep HPF POC: NONE SEEN

## 2017-07-19 LAB — GLUCOSE, CAPILLARY: GLUCOSE-CAPILLARY: 101 mg/dL — AB (ref 65–99)

## 2017-07-19 MED ORDER — INSULIN ASPART 100 UNIT/ML FLEXPEN: PEN_INJECTOR | SUBCUTANEOUS | 11 refills | Status: DC

## 2017-07-19 NOTE — Discharge Instructions (Signed)

## 2017-07-19 NOTE — Progress Notes (Addendum)
   PRENATAL VISIT NOTE  Subjective:  Alison Rowe is a 31 y.o. G2P1001 at 2642w6d being seen today for ongoing prenatal care.  She is currently monitored for the following issues for this high-risk pregnancy and has Supervision of high risk pregnancy, antepartum; Type 2 diabetes mellitus in pregnancy; History of cesarean section complicating pregnancy; and LGA (large for gestational age) fetus affecting management of mother on their problem list.  Patient reports no complaints.  Contractions: Irregular. Vag. Bleeding: None.  Movement: Present. Denies leaking of fluid.   The following portions of the patient's history were reviewed and updated as appropriate: allergies, current medications, past family history, past medical history, past social history, past surgical history and problem list. Problem list updated.  Objective:   Vitals:   07/19/17 1023  BP: 105/67  Pulse: (!) 103  Weight: 228 lb (103.4 kg)    Fetal Status: Fetal Heart Rate (bpm): NST   Movement: Present     General:  Alert, oriented and cooperative. Patient is in no acute distress.  Skin: Skin is warm and dry. No rash noted.   Cardiovascular: Normal heart rate noted  Respiratory: Normal respiratory effort, no problems with respiration noted  Abdomen: Soft, gravid, appropriate for gestational age.  Pain/Pressure: Present     Pelvic: Cervical exam performed Dilation: Closed Effacement (%): Thick Station: Ballotable  Extremities: Normal range of motion.     Mental Status:  Normal mood and affect. Normal behavior. Normal judgment and thought content.   Assessment and Plan:  Pregnancy: G2P1001 at 4342w6d  1. Supervision of high risk pregnancy, antepartum Patient is doing well without complaints Cultures today - GC/Chlamydia probe amp [facility] - Strep Gp B NAA  2. Pregnancy with type 2 diabetes mellitus in third trimester Patient did not bring CBG log or meter She reports all values being within range  with highest fasting 93 and highest pp 128 NST reviewed and non reactive (10 x 10 accels)  BPP 8/8 today EFW 4319 gm with AC >97%tile today. Discussed risks of protracted labor, shoulder dystocia along with its associated sequela and increased risks of c-section. Patient verbalized understanding  3. History of cesarean section complicating pregnancy Desires TOLAC  Preterm labor symptoms and general obstetric precautions including but not limited to vaginal bleeding, contractions, leaking of fluid and fetal movement were reviewed in detail with the patient. Please refer to After Visit Summary for other counseling recommendations.  Return in about 7 days (around 07/26/2017) for weekly as scheduled.   Catalina AntiguaPeggy Aksel Bencomo, MD

## 2017-07-19 NOTE — MAU Note (Signed)
Pt here with c/o contractions since about 1830 and spotting since about 1230. Was seen in the office this AM and cervix was checked. Also c/o pain when urinating. Reports good fetal movement.

## 2017-07-19 NOTE — MAU Provider Note (Signed)
History    CSN: [id number] Arrival date and time: 07/19/17 2124  First Provider Initiated Contact with Patient 07/19/17 2216      Chief Complaint  Patient presents with  . Contractions  . Vaginal Bleeding    HPI: Alison Rowe is a 31 y.o. G2P1001 with IUP at 2268w6d who presents to maternity admissions reporting contractions and spotting. She was seen earlier today in clinic and has her cervix checked. Later noted small amount of blood on underwear (spotting), and since 18:30 she had contractions. Reports contractions about every 8 minutes. Denies LOF. Reports good fetal movement.   Denies any abnormal vaginal discharge, fevers, chills, nausea, vomiting, urinary frequency or hematuria. Endorses some burning with urination, but reports this is just since having cervix checked this morning.   [facility] Pregnancy complicated by T2DM, LGA and hx of C/S. She reports BG have been wnl.   Past obstetric history: OB History  Gravida Para Term Preterm AB Living  2 1 1  0 0 1  SAB TAB Ectopic Multiple Live Births  0 0 0 0 1    # Outcome Date GA Lbr Len/2nd Weight Sex Delivery Anes PTL Lv  2 Current           1 Term 07/06/12 1366w0d  7 lb 9 oz (3.43 kg) F CS-Unspec   LIV     Complications: Gestational diabetes      Past Medical History:  Diagnosis Date  . Gestational diabetes 2013  . Heart murmur   . Hyperlipidemia   . Post partum depression 2013  . Type II diabetes mellitus (HCC)     Past Surgical History:  Procedure Laterality Date  . BREAST BIOPSY Right 12/11/2015   chronic abscess incision and drainage, incisional biopsy  . CESAREAN SECTION  2013  . LAPAROSCOPIC CHOLECYSTECTOMY  11/2013    Family History  Problem Relation Age of Onset  . Diabetes Mother   . Diabetes Father   . Hyperlipidemia Father   . Hypertension Father   . Diabetes Maternal Grandmother     Social History   Tobacco Use  . Smoking status: Former Smoker    Packs/day: 0.12   Years: 1.00    Pack years: 0.12    Types: Cigarettes    Last attempt to quit: 12/08/2009    Years since quitting: 7.6  . Smokeless tobacco: Never Used  Substance Use Topics  . Alcohol use: No    Alcohol/week: 2.4 oz    Types: 4 Shots of liquor per week  . Drug use: No    Allergies: No Known Allergies  Medications Prior to Admission  Medication Sig Dispense Refill Last Dose  . acetaminophen (TYLENOL) 500 MG tablet Take 500 mg by mouth every 6 (six) hours as needed.   Past Week at Unknown time  . ASPIRIN LOW DOSE 81 MG chewable tablet Chew 1 tablet (81 mg total) by mouth daily. 30 tablet 11 07/18/2017 at Unknown time  . glucose blood (ACCU-CHEK AVIVA PLUS) test strip Use as instructed 100 each 12 07/19/2017 at Unknown time  . hydrOXYzine (ATARAX/VISTARIL) 50 MG tablet Take 1 tablet (50 mg total) by mouth every 6 (six) hours as needed for anxiety (insomnia). 30 tablet 2 07/18/2017 at Unknown time  . insulin aspart (NOVOLOG FLEXPEN) 100 UNIT/ML FlexPen Take 30 units before meals 15 mL 11 07/19/2017 at Unknown time  . insulin NPH Human (NOVOLIN N) 100 UNIT/ML injection 30 units with breakfast  36 units at night before  bedtime. 10 mL 3 07/19/2017 at Unknown time  . Insulin Pen Needle 31G X 8 MM MISC 1 Device by Does not apply route as directed. 100 each 5 07/19/2017 at Unknown time  . Insulin Syringe-Needle U-100 (INSULIN SYRINGE .5CC/31GX5/16") 31G X 5/16" 0.5 ML MISC 1 Device by Does not apply route at bedtime. 100 each 8 07/19/2017 at Unknown time  . Lancets (ACCU-CHEK MULTICLIX) lancets Use as instructed 100 each 12 07/19/2017 at Unknown time  . metFORMIN (GLUCOPHAGE) 1000 MG tablet Take 1 tablet (1,000 mg total) by mouth 2 (two) times daily. 60 tablet 3 07/19/2017 at Unknown time  . Prenatal Vit-Fe Fumarate-FA (PREPLUS) 27-1 MG TABS TK 1 T PO QD  11 07/18/2017 at Unknown time    Review of Systems - Negative except for what is mentioned in HPI.  Physical Exam   Blood pressure 119/74, pulse (!)  101, temperature 98.4 F (36.9 C), temperature source Oral, resp. rate 18, height 5\' 8"  (1.727 m), weight 233 lb (105.7 kg), last menstrual period 11/10/2016, SpO2 100 %.  Constitutional: Well-developed, well-nourished female in no acute distress.  HENT: [place]/AT, normal oropharynx mucosa. MMM Eyes: normal conjunctivae, no scleral icterus Cardiovascular: normal rate Respiratory: normal effort GI: Abd soft, non-tender, gravid appropriate for gestational age.   GU: Neg CVAT. Pelvic: NEFG, physiologic discharge, no blood, cervix clean. No CMT SVE: FT/thick MSK: Extremities nontender, no edema Neurologic: Alert and oriented x 4. Psych: Normal mood and affect Skin: warm and dry   FHT:  Baseline 140 , moderate variability, 15x15 accelerations present, no decelerations Toco: rare contraction; patient comfortable with them  MAU Course/MDM:   Pt seen and evaluated. VS reviewed, wnl. Reactive NST Rare contractions. SVE reassuring No blood noted on speculum exam. Spotting likely from cervical check this morning.  Wet prep done, negative.  Had cultures collected this morning in clinic.  Urine sent for culture  Assessment and Plan  Assessment: 1. Braxton Hick's contraction   2. Vaginal spotting   3. NST (non-stress test) reactive     Plan: --Discharge home in stable condition.  --Labor precautions and return precautions.    Kashia Brossard, Kandra NicolasJulie P, MD 07/19/2017 10:16 PM

## 2017-07-19 NOTE — Progress Notes (Signed)
US for growth and BPP done @ [facility] today.  BPP = 8/8.  NST tracing reviewed by Dr. Jolayne Pantheronstant

## 2017-07-19 NOTE — Progress Notes (Signed)
Pt needs new Rx for Novolog Flexpen

## 2017-07-20 LAB — GC/CHLAMYDIA PROBE AMP (~~LOC~~) NOT AT ARMC
CHLAMYDIA, DNA PROBE: NEGATIVE
Neisseria Gonorrhea: NEGATIVE

## 2017-07-21 ENCOUNTER — Encounter: Payer: Self-pay | Admitting: Obstetrics and Gynecology

## 2017-07-21 DIAGNOSIS — O99820 Streptococcus B carrier state complicating pregnancy: Secondary | ICD-10-CM | POA: Insufficient documentation

## 2017-07-21 LAB — CULTURE, OB URINE: Culture: 3000 — AB

## 2017-07-21 LAB — STREP GP B NAA: Strep Gp B NAA: POSITIVE — AB

## 2017-07-25 ENCOUNTER — Inpatient Hospital Stay (HOSPITAL_COMMUNITY)
Admission: AD | Admit: 2017-07-25 | Discharge: 2017-07-29 | DRG: 768 | Disposition: A | Discharge status: Home / Self Care | Payer: Medicaid Other | Source: Ambulatory Visit | Attending: Family Medicine | Admitting: Family Medicine

## 2017-07-25 DIAGNOSIS — Z794 Long term (current) use of insulin: Secondary | ICD-10-CM

## 2017-07-25 DIAGNOSIS — O0990 Supervision of high risk pregnancy, unspecified, unspecified trimester: Secondary | ICD-10-CM

## 2017-07-25 DIAGNOSIS — E119 Type 2 diabetes mellitus without complications: Secondary | ICD-10-CM | POA: Diagnosis present

## 2017-07-25 DIAGNOSIS — Z7982 Long term (current) use of aspirin: Secondary | ICD-10-CM

## 2017-07-25 DIAGNOSIS — Z87891 Personal history of nicotine dependence: Secondary | ICD-10-CM

## 2017-07-25 DIAGNOSIS — Z23 Encounter for immunization: Secondary | ICD-10-CM

## 2017-07-25 DIAGNOSIS — Z880 Allergy status to penicillin: Secondary | ICD-10-CM

## 2017-07-25 DIAGNOSIS — Z3490 Encounter for supervision of normal pregnancy, unspecified, unspecified trimester: Secondary | ICD-10-CM

## 2017-07-25 DIAGNOSIS — O99824 Streptococcus B carrier state complicating childbirth: Secondary | ICD-10-CM | POA: Diagnosis present

## 2017-07-25 DIAGNOSIS — Z3A36 36 weeks gestation of pregnancy: Secondary | ICD-10-CM

## 2017-07-25 DIAGNOSIS — O34219 Maternal care for unspecified type scar from previous cesarean delivery: Secondary | ICD-10-CM

## 2017-07-25 DIAGNOSIS — O3663X0 Maternal care for excessive fetal growth, third trimester, not applicable or unspecified: Secondary | ICD-10-CM | POA: Diagnosis present

## 2017-07-25 DIAGNOSIS — O2412 Pre-existing diabetes mellitus, type 2, in childbirth: Secondary | ICD-10-CM | POA: Diagnosis present

## 2017-07-25 DIAGNOSIS — O42913 Preterm premature rupture of membranes, unspecified as to length of time between rupture and onset of labor, third trimester: Principal | ICD-10-CM | POA: Diagnosis present

## 2017-07-25 DIAGNOSIS — O99820 Streptococcus B carrier state complicating pregnancy: Secondary | ICD-10-CM

## 2017-07-26 ENCOUNTER — Other Ambulatory Visit: Payer: Self-pay

## 2017-07-26 ENCOUNTER — Encounter: Payer: Self-pay | Admitting: Obstetrics & Gynecology

## 2017-07-26 ENCOUNTER — Inpatient Hospital Stay (HOSPITAL_COMMUNITY): Payer: Medicaid Other | Admitting: Anesthesiology

## 2017-07-26 ENCOUNTER — Encounter (HOSPITAL_COMMUNITY): Payer: Self-pay | Admitting: *Deleted

## 2017-07-26 DIAGNOSIS — O34219 Maternal care for unspecified type scar from previous cesarean delivery: Secondary | ICD-10-CM | POA: Diagnosis present

## 2017-07-26 DIAGNOSIS — Z3A37 37 weeks gestation of pregnancy: Secondary | ICD-10-CM | POA: Diagnosis not present

## 2017-07-26 DIAGNOSIS — Z87891 Personal history of nicotine dependence: Secondary | ICD-10-CM | POA: Diagnosis not present

## 2017-07-26 DIAGNOSIS — O2412 Pre-existing diabetes mellitus, type 2, in childbirth: Secondary | ICD-10-CM | POA: Diagnosis present

## 2017-07-26 DIAGNOSIS — O42913 Preterm premature rupture of membranes, unspecified as to length of time between rupture and onset of labor, third trimester: Secondary | ICD-10-CM | POA: Diagnosis present

## 2017-07-26 DIAGNOSIS — Z23 Encounter for immunization: Secondary | ICD-10-CM | POA: Diagnosis not present

## 2017-07-26 DIAGNOSIS — O3663X0 Maternal care for excessive fetal growth, third trimester, not applicable or unspecified: Secondary | ICD-10-CM | POA: Diagnosis present

## 2017-07-26 DIAGNOSIS — Z880 Allergy status to penicillin: Secondary | ICD-10-CM | POA: Diagnosis not present

## 2017-07-26 DIAGNOSIS — E119 Type 2 diabetes mellitus without complications: Secondary | ICD-10-CM | POA: Diagnosis present

## 2017-07-26 DIAGNOSIS — Z3490 Encounter for supervision of normal pregnancy, unspecified, unspecified trimester: Secondary | ICD-10-CM

## 2017-07-26 DIAGNOSIS — Z7982 Long term (current) use of aspirin: Secondary | ICD-10-CM | POA: Diagnosis not present

## 2017-07-26 DIAGNOSIS — O99824 Streptococcus B carrier state complicating childbirth: Secondary | ICD-10-CM | POA: Diagnosis present

## 2017-07-26 DIAGNOSIS — Z794 Long term (current) use of insulin: Secondary | ICD-10-CM | POA: Diagnosis not present

## 2017-07-26 DIAGNOSIS — Z3A36 36 weeks gestation of pregnancy: Secondary | ICD-10-CM | POA: Diagnosis not present

## 2017-07-26 LAB — CBC
HCT: 33.6 % — ABNORMAL LOW (ref 36.0–46.0)
HEMOGLOBIN: 11.4 g/dL — AB (ref 12.0–15.0)
MCH: 30.4 pg (ref 26.0–34.0)
MCHC: 33.9 g/dL (ref 30.0–36.0)
MCV: 89.6 fL (ref 78.0–100.0)
Platelets: 217 10*3/uL (ref 150–400)
RBC: 3.75 MIL/uL — AB (ref 3.87–5.11)
RDW: 13.9 % (ref 11.5–15.5)
WBC: 9.5 10*3/uL (ref 4.0–10.5)

## 2017-07-26 LAB — GLUCOSE, CAPILLARY
GLUCOSE-CAPILLARY: 65 mg/dL (ref 65–99)
GLUCOSE-CAPILLARY: 98 mg/dL (ref 65–99)
Glucose-Capillary: 102 mg/dL — ABNORMAL HIGH (ref 65–99)
Glucose-Capillary: 69 mg/dL (ref 65–99)
Glucose-Capillary: 94 mg/dL (ref 65–99)
Glucose-Capillary: 82 mg/dL (ref 65–99)
Glucose-Capillary: 103 mg/dL — ABNORMAL HIGH (ref 65–99)

## 2017-07-26 LAB — RPR: RPR: NONREACTIVE

## 2017-07-26 LAB — ABO/RH: ABO/RH(D): O POS

## 2017-07-26 LAB — TYPE AND SCREEN
ABO/RH(D): O POS
Antibody Screen: NEGATIVE

## 2017-07-26 MED ORDER — OXYTOCIN BOLUS FROM INFUSION
500.0000 mL | Freq: Once | INTRAVENOUS | Status: AC
  Administered 2017-07-27: 500 mL via INTRAVENOUS

## 2017-07-26 MED ORDER — OXYTOCIN 40 UNITS IN LACTATED RINGERS INFUSION - SIMPLE MED
1.0000 m[IU]/min | INTRAVENOUS | Status: DC
  Administered 2017-07-26: 2 m[IU]/min via INTRAVENOUS
  Filled 2017-07-26: qty 1000

## 2017-07-26 MED ORDER — LACTATED RINGERS IV SOLN: 500.0000 mL | Freq: Once | INTRAVENOUS | Status: DC

## 2017-07-26 MED ORDER — EPHEDRINE 5 MG/ML INJ
10.0000 mg | INTRAVENOUS | Status: DC | PRN
  Filled 2017-07-26: qty 2

## 2017-07-26 MED ORDER — PHENYLEPHRINE 40 MCG/ML (10ML) SYRINGE FOR IV PUSH (FOR BLOOD PRESSURE SUPPORT)
80.0000 ug | PREFILLED_SYRINGE | INTRAVENOUS | Status: DC | PRN
  Filled 2017-07-26: qty 10
  Filled 2017-07-26: qty 5

## 2017-07-26 MED ORDER — PHENYLEPHRINE 40 MCG/ML (10ML) SYRINGE FOR IV PUSH (FOR BLOOD PRESSURE SUPPORT)
80.0000 ug | PREFILLED_SYRINGE | INTRAVENOUS | Status: DC | PRN
  Filled 2017-07-26: qty 5

## 2017-07-26 MED ORDER — FENTANYL CITRATE (PF) 100 MCG/2ML IJ SOLN
100.0000 ug | INTRAMUSCULAR | Status: DC | PRN
  Administered 2017-07-26 (×2): 100 ug via INTRAVENOUS

## 2017-07-26 MED ORDER — FLEET ENEMA 7-19 GM/118ML RE ENEM
1.0000 | ENEMA | RECTAL | Status: DC | PRN
Start: 2017-07-26 — End: 2017-07-27

## 2017-07-26 MED ORDER — LACTATED RINGERS IV SOLN
INTRAVENOUS | Status: DC
  Administered 2017-07-26: 16:00:00 via INTRAUTERINE

## 2017-07-26 MED ORDER — LIDOCAINE HCL (PF) 1 % IJ SOLN
INTRAMUSCULAR | Status: DC | PRN
  Administered 2017-07-26: 2 mL
  Administered 2017-07-26: 3 mL
  Administered 2017-07-26: 5 mL

## 2017-07-26 MED ORDER — LIDOCAINE HCL (PF) 1 % IJ SOLN: INTRAMUSCULAR | Status: DC | PRN

## 2017-07-26 MED ORDER — LACTATED RINGERS IV SOLN
500.0000 mL | INTRAVENOUS | Status: DC | PRN
  Administered 2017-07-26: 500 mL via INTRAVENOUS

## 2017-07-26 MED ORDER — SOD CITRATE-CITRIC ACID 500-334 MG/5ML PO SOLN: 30.0000 mL | ORAL | Status: DC | PRN

## 2017-07-26 MED ORDER — LACTATED RINGERS IV SOLN
INTRAVENOUS | Status: DC
  Administered 2017-07-26: 03:00:00 via INTRAVENOUS

## 2017-07-26 MED ORDER — PENICILLIN G POT IN DEXTROSE 60000 UNIT/ML IV SOLN
3.0000 10*6.[IU] | INTRAVENOUS | Status: DC
  Administered 2017-07-26 (×5): 3 10*6.[IU] via INTRAVENOUS
  Filled 2017-07-26 (×10): qty 50

## 2017-07-26 MED ORDER — FENTANYL CITRATE (PF) 100 MCG/2ML IJ SOLN
100.0000 ug | INTRAMUSCULAR | Status: DC | PRN
  Filled 2017-07-26 (×2): qty 2

## 2017-07-26 MED ORDER — OXYTOCIN 40 UNITS IN LACTATED RINGERS INFUSION - SIMPLE MED: 2.5000 [IU]/h | INTRAVENOUS | Status: DC

## 2017-07-26 MED ORDER — TERBUTALINE SULFATE 1 MG/ML IJ SOLN
0.2500 mg | Freq: Once | INTRAMUSCULAR | Status: DC | PRN
  Filled 2017-07-26: qty 1

## 2017-07-26 MED ORDER — ONDANSETRON HCL 4 MG/2ML IJ SOLN: 4.0000 mg | Freq: Four times a day (QID) | INTRAMUSCULAR | Status: DC | PRN

## 2017-07-26 MED ORDER — LIDOCAINE HCL (PF) 1 % IJ SOLN
30.0000 mL | INTRAMUSCULAR | Status: DC | PRN
  Filled 2017-07-26: qty 30

## 2017-07-26 MED ORDER — LACTATED RINGERS IV SOLN
500.0000 mL | Freq: Once | INTRAVENOUS | Status: AC
  Administered 2017-07-26: 500 mL via INTRAVENOUS

## 2017-07-26 MED ORDER — FENTANYL 2.5 MCG/ML BUPIVACAINE 1/10 % EPIDURAL INFUSION (WH - ANES)
14.0000 mL/h | INTRAMUSCULAR | Status: DC | PRN
  Administered 2017-07-26 (×3): 14 mL/h via EPIDURAL
  Filled 2017-07-26 (×4): qty 100

## 2017-07-26 MED ORDER — OXYCODONE-ACETAMINOPHEN 5-325 MG PO TABS: 1.0000 | ORAL_TABLET | ORAL | Status: DC | PRN

## 2017-07-26 MED ORDER — ACETAMINOPHEN 325 MG PO TABS
650.0000 mg | ORAL_TABLET | ORAL | Status: DC | PRN
  Administered 2017-07-26: 650 mg via ORAL
  Filled 2017-07-26: qty 2

## 2017-07-26 MED ORDER — PENICILLIN G POTASSIUM 5000000 UNITS IJ SOLR
5.0000 10*6.[IU] | Freq: Once | INTRAMUSCULAR | Status: AC
  Administered 2017-07-26: 5 10*6.[IU] via INTRAVENOUS
  Filled 2017-07-26: qty 5

## 2017-07-26 MED ORDER — OXYCODONE-ACETAMINOPHEN 5-325 MG PO TABS: 2.0000 | ORAL_TABLET | ORAL | Status: DC | PRN

## 2017-07-26 MED ORDER — DIPHENHYDRAMINE HCL 50 MG/ML IJ SOLN: 12.5000 mg | INTRAMUSCULAR | Status: DC | PRN

## 2017-07-26 NOTE — MAU Note (Signed)
Pt reports water broke at 11:30pm-clear with some blood. Pt reports contractions every 7 mins. Reports good fetal movement.

## 2017-07-26 NOTE — Progress Notes (Signed)
Labor Progress Note Alison Rowe is a 31 y.o. G2P1001 at 6741w6d presented for TOLAC after SROM at home. S: No complaints  O:  BP 121/87   Pulse 87   Temp 98.3 F (36.8 C) (Oral)   Resp 14   Ht 5\' 8"  (1.727 m)   Wt 243 lb (110.2 kg)   LMP 11/10/2016 (Approximate)   SpO2 96%   BMI 36.95 kg/m  EFM: 150/mod var/no decels  CVE: Dilation: Lip/rim Effacement (%): 90 Cervical Position: Middle Station: 0 Presentation: Vertex Exam by:: Dr. Rachelle HoraMoss   A&P: [age] y.o. G2P1001 9241w6d here for TOLAC after SROM at home. #Labor: Unable to reduce anterior cervical lip. MVU currently 150. Will add pitocin until adequate. contraction pattern. Anticipate SVD. #Pain: epidural  Rolm BookbinderAmber Anshi Jalloh, DO 8:51 PM

## 2017-07-26 NOTE — H&P (Signed)
Obstetric History and Physical  Alison Rowe is a 31 y.o. G2P1001 with IUP at 4558w6d presenting for SROM at home around 11:30 pm. Patient states she has been having  Regular contractions, every 7 minutes, none vaginal bleeding, intact, ruptured membranes, with active fetal movement.  Fern positive in MAU.   Patient has history of c-section and desires TOLAC.Pregnancy also complicated by T2DM on insulin.  Prenatal Course [facility] Pregnancy complications or risks: Patient Active Problem List   Diagnosis Date Noted  . GBS (group B Streptococcus carrier), +RV culture, currently pregnant 07/21/2017  . LGA (large for gestational age) fetus affecting management of mother 06/22/2017  . Supervision of high risk pregnancy, antepartum 01/27/2017  . Type 2 diabetes mellitus in pregnancy 01/27/2017  . History of cesarean section complicating pregnancy 01/27/2017   She plans to breastfeed She desires IUD for postpartum contraception.     Clinic [facility] Prenatal Labs  Dating LMP Blood type: O/Positive/-- (05/07 0000)   Genetic Screen Quad:  neg     Antibody:Negative (05/07 0000)  Anatomic US Nml anatomy; will need serial growth US Rubella: Immune (05/07 0000)  GTT Type 2 DM RPR:   neg  Flu vaccine Declined  HBsAg: Negative (05/07 0000)   TDaP vaccine 06/01/17                                              HIV: Non-reactive (05/07 0000)   Baby Food  Breast                                            GBS: positive (For PCN allergy, check sensitivities)  Contraception  Mirena Pap:nml  Circumcision no   Pediatrician  Dr Jake SamplesAthena, [facility]   Support Person FOB: Efrain     Prenatal Transfer Tool  Maternal Diabetes: Yes:  Diabetes Type:  Pre-pregnancy Genetic Screening: Normal Maternal Ultrasounds/Referrals: Normal Fetal Ultrasounds or other Referrals:  None Maternal Substance Abuse:  No Significant Maternal Medications:  Meds include: Other: insulin, metformin Significant  Maternal Lab Results: GBS positive  Past Medical History:  Diagnosis Date  . Gestational diabetes 2013  . Heart murmur   . Hyperlipidemia   . Post partum depression 2013  . Type II diabetes mellitus (HCC)     Past Surgical History:  Procedure Laterality Date  . BREAST BIOPSY Right 12/11/2015   chronic abscess incision and drainage, incisional biopsy  . CESAREAN SECTION  2013  . LAPAROSCOPIC CHOLECYSTECTOMY  11/2013    OB History  Gravida Para Term Preterm AB Living  2 1 1  0 0 1  SAB TAB Ectopic Multiple Live Births  0 0 0 0 1    # Outcome Date GA Lbr Len/2nd Weight Sex Delivery Anes PTL Lv  2 Current           1 Term 07/06/12 3137w0d  7 lb 9 oz (3.43 kg) F CS-Unspec   LIV     Complications: Gestational diabetes      Social History   Socioeconomic History  . Marital status: Single    Spouse name: None  . Number of children: 1  . Years of education: None  . Highest education level: None  Social Needs  . Financial resource strain: None  .  Food insecurity - worry: None  . Food insecurity - inability: None  . Transportation needs - medical: None  . Transportation needs - non-medical: None  Occupational History  . Occupation: homemaker  Tobacco Use  . Smoking status: Former Smoker    Packs/day: 0.12    Years: 1.00    Pack years: 0.12    Types: Cigarettes    Last attempt to quit: 12/08/2009    Years since quitting: 7.6  . Smokeless tobacco: Never Used  Substance and Sexual Activity  . Alcohol use: No    Alcohol/week: 2.4 oz    Types: 4 Shots of liquor per week  . Drug use: No  . Sexual activity: Yes    Birth control/protection: None  Other Topics Concern  . None  Social History Narrative  . None    Family History  Problem Relation Age of Onset  . Diabetes Mother   . Diabetes Father   . Hyperlipidemia Father   . Hypertension Father   . Diabetes Maternal Grandmother     Medications Prior to Admission  Medication Sig Dispense Refill Last Dose  .  acetaminophen (TYLENOL) 500 MG tablet Take 500 mg by mouth every 6 (six) hours as needed.   Past Week at Unknown time  . ASPIRIN LOW DOSE 81 MG chewable tablet Chew 1 tablet (81 mg total) by mouth daily. 30 tablet 11 07/18/2017 at Unknown time  . glucose blood (ACCU-CHEK AVIVA PLUS) test strip Use as instructed 100 each 12 07/19/2017 at Unknown time  . hydrOXYzine (ATARAX/VISTARIL) 50 MG tablet Take 1 tablet (50 mg total) by mouth every 6 (six) hours as needed for anxiety (insomnia). 30 tablet 2 07/18/2017 at Unknown time  . insulin aspart (NOVOLOG FLEXPEN) 100 UNIT/ML FlexPen Take 30 units before meals 15 mL 11 07/19/2017 at Unknown time  . insulin NPH Human (NOVOLIN N) 100 UNIT/ML injection 30 units with breakfast  36 units at night before bedtime. 10 mL 3 07/19/2017 at Unknown time  . Insulin Pen Needle 31G X 8 MM MISC 1 Device by Does not apply route as directed. 100 each 5 07/19/2017 at Unknown time  . Insulin Syringe-Needle U-100 (INSULIN SYRINGE .5CC/31GX5/16") 31G X 5/16" 0.5 ML MISC 1 Device by Does not apply route at bedtime. 100 each 8 07/19/2017 at Unknown time  . Lancets (ACCU-CHEK MULTICLIX) lancets Use as instructed 100 each 12 07/19/2017 at Unknown time  . metFORMIN (GLUCOPHAGE) 1000 MG tablet Take 1 tablet (1,000 mg total) by mouth 2 (two) times daily. 60 tablet 3 07/19/2017 at Unknown time  . Prenatal Vit-Fe Fumarate-FA (PREPLUS) 27-1 MG TABS TK 1 T PO QD  11 07/18/2017 at Unknown time    No Known Allergies  Review of Systems: Negative except for what is mentioned in HPI.  Physical Exam: BP 130/87 (BP Location: Left Arm)   Pulse 88   Temp 98.1 F (36.7 C) (Oral)   Resp 19   Ht 5\' 8"  (1.727 m)   Wt 243 lb (110.2 kg)   LMP 11/10/2016 (Approximate)   SpO2 97%   BMI 36.95 kg/m  CONSTITUTIONAL: Well-developed, well-nourished female in no acute distress.  HENT:  Normocephalic, atraumatic, External right and left ear normal. Oropharynx is clear and moist EYES: Conjunctivae and EOM  are normal. Pupils are equal, round, and reactive to light. No scleral icterus.  NECK: Normal range of motion, supple, no masses SKIN: Skin is warm and dry. No rash noted. Not diaphoretic. No erythema. No pallor. NEUROLOGIC: Alert and  oriented to person, place, and time. Normal reflexes, muscle tone coordination. No cranial nerve deficit noted. PSYCHIATRIC: Normal mood and affect. Normal behavior. Normal judgment and thought content. CARDIOVASCULAR: Normal heart rate noted, regular rhythm RESPIRATORY: Effort and breath sounds normal, no problems with respiration noted ABDOMEN: Soft, nontender, nondistended, gravid. MUSCULOSKELETAL: Normal range of motion. No edema and no tenderness. 2+ distal pulses.  Cervical Exam: Dilatation 1cm   Effacement thick   Station high   Presentation: cephalic FHT:  Baseline rate 150 bpm   Variability moderate  Accelerations present   Decelerations none Contractions: Every 1 mins   Pertinent Labs/Studies:   No results found for this or any previous visit (from the past 24 hour(s)).  Assessment : Alison Rowe is a 31 y.o. G2P1001 at 7513w6d being admitted for PPROM.  Plan: Labor: Begin induction with low dow pitocin. Do not give cytotec. Analgesia as needed. FWB: Reassuring fetal heart tracing.  GBS positive-PCN to treat. Delivery plan: TOLAC consent signed, proceed with TOLAC. Discussed risks of shoulder dystocia and associated complications due to estimated fetal size. #T2DM- CBG during labor, insulin as needed.  #MOC: IUD #MOF: breast  Rolm BookbinderAmber Helmuth Recupero, DO

## 2017-07-26 NOTE — Anesthesia Procedure Notes (Signed)
Epidural Patient location during procedure: OB Start time: 07/26/2017 6:00 AM End time: 07/26/2017 6:10 AM  Staffing Anesthesiologist: Marcene DuosFitzgerald, Shamia Uppal, MD Performed: anesthesiologist   Preanesthetic Checklist Completed: patient identified, site marked, surgical consent, pre-op evaluation, timeout performed, IV checked, risks and benefits discussed and monitors and equipment checked  Epidural Patient position: sitting Prep: site prepped and draped and DuraPrep Patient monitoring: continuous pulse ox and blood pressure Approach: midline Location: L4-L5 Injection technique: LOR air  Needle:  Needle type: Tuohy  Needle gauge: 17 G Needle length: 9 cm and 9 Needle insertion depth: 8 cm Catheter type: closed end flexible Catheter size: 19 Gauge Catheter at skin depth: 14 cm Test dose: negative  Assessment Events: blood not aspirated, injection not painful, no injection resistance, negative IV test and no paresthesia

## 2017-07-26 NOTE — Progress Notes (Signed)
Labor Progress Note Alison Rowe is a 31 y.o. G2P1001 at 4654w6d presented for SOL with SROM@2330 (07/26/17) S: patient resting comfortably in bed. Denies feeling pressure at the moment.   O:  BP 124/81   Pulse (!) 104   Temp 99.9 F (37.7 C)   Resp 16   Ht 5\' 8"  (1.727 m)   Wt 243 lb (110.2 kg)   LMP 11/10/2016 (Approximate)   SpO2 96%   BMI 36.95 kg/m  EFM: 160/mod vari/reactive  CVE: Dilation: 8 Effacement (%): 90 Cervical Position: Middle Station: -1 Presentation: Vertex Exam by:: m wilkins rnc   A&P: 31 y.o. G2P1001 9254w6d SOL with SROM@2330 (07/26/17) #Labor: Progressing well.  Cervical check at 9.5/90/-1,0 #Pain: epidural #FWB: cat 1 #GBS positive-PCN #T2DM: CBGs well controlled 94. Continue to monitor  [name], MD 4:57 PM

## 2017-07-26 NOTE — Progress Notes (Signed)
Labor Progress Note Alison Rowe is a 31 y.o. G2P1001 at 4552w6d presented for SOL with SROM@2330 (07/26/17) S: Patient is resting comfortably in bed. She states that she has some blurry vision but denies headaches or RUQ pain.  O:  BP 111/71   Pulse (!) 112   Temp 99.2 F (37.3 C) (Oral)   Resp 16   Ht 5\' 8"  (1.727 m)   Wt 243 lb (110.2 kg)   LMP 11/10/2016 (Approximate)   SpO2 96%   BMI 36.95 kg/m  EFM: 150/mod vari/reactive  CVE: Dilation: 8 Effacement (%): 90 Cervical Position: Middle Station: -1 Presentation: Vertex Exam by:: m wilkins rnc   A&P: 31 y.o. G2P1001 4752w6d SOL with SROM@2330  (07/26/17) #Labor: Progressing well. Continue to monitor. Consider starting pit if progression slows. #Pain: epidural #FWB: cat 1 #GBS positive PCN #T2DM: CBGs well controlled 94. Continue to monitor  Suella BroadKeriann S Jade Burkard, MD 2:48 PM

## 2017-07-26 NOTE — Progress Notes (Addendum)
Patient ID: Alison Rowe, female   DOB: May 10, 1986, 31 y.o.   MRN: [id number]  Pt comfortable with epidural. Pitocin on.  BP 111/71   Pulse (!) 112   Temp 99.2 F (37.3 C) (Oral)   Resp 16   Ht 5\' 8"  (1.727 m)   Wt 243 lb (110.2 kg)   LMP 11/10/2016 (Approximate)   SpO2 96%   BMI 36.95 kg/m   Dilation: 4 Effacement (%): 80 Cervical Position: Middle Station: -2 Presentation: Vertex Exam by:: Odilia Damico, DO  FHT: 150s, mild variability, variables, no accels - Cat 2 tracing.  IUPC placed Continue PCN   [name], DO 07/26/2017 9:12 AM

## 2017-07-26 NOTE — Anesthesia Pain Management Evaluation Note (Signed)
  [facility] Pain Management Visit Note  Patient: Alison Rowe, 31 y.o., female  "Hello I am a member of the anesthesia team at [facility]. We have an anesthesia team available at all times to provide care throughout the hospital, including epidural management and anesthesia for C-section. I don't know your plan for the delivery whether it a natural birth, water birth, IV sedation, nitrous supplementation, doula or epidural, but we want to meet your pain goals."   1.Was your pain managed to your expectations on prior hospitalizations?   Yes   2.What is your expectation for pain management during this hospitalization?     Epidural  3.How can we help you reach that goal?   Record the patient's initial score and the patient's pain goal.   Pain: 4  Pain Goal: 3 [facility] wants you to be able to say your pain was always managed very well.  Alison Rowe,Alison Rowe 07/26/2017

## 2017-07-26 NOTE — Anesthesia Preprocedure Evaluation (Signed)
Anesthesia Evaluation  Patient identified by MRN, date of birth, ID band Patient awake    Reviewed: Allergy & Precautions, Patient's Chart, lab work & pertinent test results  Airway Mallampati: III  TM Distance: >3 FB     Dental   Pulmonary former smoker,    Pulmonary exam normal        Cardiovascular negative cardio ROS Normal cardiovascular exam     Neuro/Psych negative neurological ROS     GI/Hepatic negative GI ROS, Neg liver ROS,   Endo/Other  diabetes, Type 2  Renal/GU negative Renal ROS     Musculoskeletal   Abdominal   Peds  Hematology negative hematology ROS (+)   Anesthesia Other Findings   Reproductive/Obstetrics (+) Pregnancy (TOLAC)                             Lab Results  Component Value Date   WBC 9.5 07/26/2017   HGB 11.4 (L) 07/26/2017   HCT 33.6 (L) 07/26/2017   MCV 89.6 07/26/2017   PLT 217 07/26/2017    Anesthesia Physical Anesthesia Plan  ASA: III  Anesthesia Plan: Epidural   Post-op Pain Management:    Induction:   PONV Risk Score and Plan: Treatment may vary due to age or medical condition  Airway Management Planned: Natural Airway  Additional Equipment:   Intra-op Plan:   Post-operative Plan:   Informed Consent: I have reviewed the patients History and Physical, chart, labs and discussed the procedure including the risks, benefits and alternatives for the proposed anesthesia with the patient or authorized representative who has indicated his/her understanding and acceptance.     Plan Discussed with:   Anesthesia Plan Comments:         Anesthesia Quick Evaluation

## 2017-07-27 ENCOUNTER — Encounter: Payer: Self-pay | Admitting: Advanced Practice Midwife

## 2017-07-27 ENCOUNTER — Encounter (HOSPITAL_COMMUNITY): Payer: Self-pay | Admitting: *Deleted

## 2017-07-27 DIAGNOSIS — O34219 Maternal care for unspecified type scar from previous cesarean delivery: Secondary | ICD-10-CM

## 2017-07-27 DIAGNOSIS — O2412 Pre-existing diabetes mellitus, type 2, in childbirth: Secondary | ICD-10-CM

## 2017-07-27 DIAGNOSIS — Z3A37 37 weeks gestation of pregnancy: Secondary | ICD-10-CM

## 2017-07-27 LAB — CBC
HEMATOCRIT: 30.1 % — AB (ref 36.0–46.0)
Hemoglobin: 10.3 g/dL — ABNORMAL LOW (ref 12.0–15.0)
MCH: 30.9 pg (ref 26.0–34.0)
MCHC: 34.2 g/dL (ref 30.0–36.0)
MCV: 90.4 fL (ref 78.0–100.0)
Platelets: 223 10*3/uL (ref 150–400)
RBC: 3.33 MIL/uL — AB (ref 3.87–5.11)
RDW: 13.9 % (ref 11.5–15.5)
WBC: 20.4 10*3/uL — AB (ref 4.0–10.5)

## 2017-07-27 LAB — GLUCOSE, CAPILLARY
GLUCOSE-CAPILLARY: 98 mg/dL (ref 65–99)
Glucose-Capillary: 181 mg/dL — ABNORMAL HIGH (ref 65–99)
Glucose-Capillary: 167 mg/dL — ABNORMAL HIGH (ref 65–99)

## 2017-07-27 MED ORDER — METHYLERGONOVINE MALEATE 0.2 MG/ML IJ SOLN
0.2000 mg | Freq: Once | INTRAMUSCULAR | Status: AC
  Administered 2017-07-27: 0.2 mg via INTRAMUSCULAR

## 2017-07-27 MED ORDER — INFLUENZA VAC SPLIT QUAD 0.5 ML IM SUSY
0.5000 mL | PREFILLED_SYRINGE | INTRAMUSCULAR | Status: AC
  Administered 2017-07-29: 0.5 mL via INTRAMUSCULAR
  Filled 2017-07-27: qty 0.5

## 2017-07-27 MED ORDER — ONDANSETRON HCL 4 MG/2ML IJ SOLN
4.0000 mg | INTRAMUSCULAR | Status: DC | PRN
Start: 2017-07-27 — End: 2017-07-29

## 2017-07-27 MED ORDER — MISOPROSTOL 200 MCG PO TABS
800.0000 ug | ORAL_TABLET | Freq: Once | ORAL | Status: AC
  Administered 2017-07-27: 800 ug via BUCCAL

## 2017-07-27 MED ORDER — SENNOSIDES-DOCUSATE SODIUM 8.6-50 MG PO TABS
2.0000 | ORAL_TABLET | ORAL | Status: DC
  Administered 2017-07-27: 2 via ORAL
  Filled 2017-07-27: qty 2

## 2017-07-27 MED ORDER — METFORMIN HCL 500 MG PO TABS
1000.0000 mg | ORAL_TABLET | Freq: Two times a day (BID) | ORAL | Status: DC
  Administered 2017-07-27 – 2017-07-29 (×4): 1000 mg via ORAL
  Filled 2017-07-27 (×6): qty 2

## 2017-07-27 MED ORDER — BENZOCAINE-MENTHOL 20-0.5 % EX AERO
1.0000 "application " | INHALATION_SPRAY | CUTANEOUS | Status: DC | PRN
  Administered 2017-07-27 – 2017-07-29 (×2): 1 via TOPICAL
  Filled 2017-07-27 (×3): qty 56

## 2017-07-27 MED ORDER — TETANUS-DIPHTH-ACELL PERTUSSIS 5-2.5-18.5 LF-MCG/0.5 IM SUSP: 0.5000 mL | Freq: Once | INTRAMUSCULAR | Status: DC

## 2017-07-27 MED ORDER — SIMETHICONE 80 MG PO CHEW: 80.0000 mg | CHEWABLE_TABLET | ORAL | Status: DC | PRN

## 2017-07-27 MED ORDER — DIBUCAINE 1 % RE OINT
1.0000 "application " | TOPICAL_OINTMENT | RECTAL | Status: DC | PRN
  Administered 2017-07-27 – 2017-07-29 (×2): 1 via RECTAL
  Filled 2017-07-27 (×3): qty 28

## 2017-07-27 MED ORDER — ACETAMINOPHEN 325 MG PO TABS
650.0000 mg | ORAL_TABLET | ORAL | Status: DC | PRN
  Administered 2017-07-27 – 2017-07-29 (×5): 650 mg via ORAL
  Filled 2017-07-27 (×5): qty 2

## 2017-07-27 MED ORDER — PRENATAL MULTIVITAMIN CH
1.0000 | ORAL_TABLET | Freq: Every day | ORAL | Status: DC
  Administered 2017-07-28: 1 via ORAL
  Filled 2017-07-27: qty 1

## 2017-07-27 MED ORDER — METHYLERGONOVINE MALEATE 0.2 MG/ML IJ SOLN
INTRAMUSCULAR | Status: AC
  Filled 2017-07-27: qty 1

## 2017-07-27 MED ORDER — DIPHENHYDRAMINE HCL 25 MG PO CAPS: 25.0000 mg | ORAL_CAPSULE | Freq: Four times a day (QID) | ORAL | Status: DC | PRN

## 2017-07-27 MED ORDER — ONDANSETRON HCL 4 MG PO TABS: 4.0000 mg | ORAL_TABLET | ORAL | Status: DC | PRN

## 2017-07-27 MED ORDER — COCONUT OIL OIL: 1.0000 "application " | TOPICAL_OIL | Status: DC | PRN

## 2017-07-27 MED ORDER — WITCH HAZEL-GLYCERIN EX PADS
1.0000 "application " | MEDICATED_PAD | CUTANEOUS | Status: DC | PRN
  Administered 2017-07-27 – 2017-07-29 (×2): 1 via TOPICAL

## 2017-07-27 MED ORDER — MISOPROSTOL 200 MCG PO TABS
ORAL_TABLET | ORAL | Status: AC
  Filled 2017-07-27: qty 4

## 2017-07-27 MED ORDER — IBUPROFEN 600 MG PO TABS
600.0000 mg | ORAL_TABLET | Freq: Four times a day (QID) | ORAL | Status: DC
  Administered 2017-07-27 – 2017-07-29 (×7): 600 mg via ORAL
  Filled 2017-07-27 (×8): qty 1

## 2017-07-27 MED ORDER — ZOLPIDEM TARTRATE 5 MG PO TABS: 5.0000 mg | ORAL_TABLET | Freq: Every evening | ORAL | Status: DC | PRN

## 2017-07-27 NOTE — Progress Notes (Signed)
Dr. Ashok PallWouk informed of CBG of 181 mg./dl. taken recently prior to eating breakfast. I also informed him that the patient did not have any medication orders for glucose control/management. Dr. Ashok PallWouk stated that a resident will visit patient to discuss resumption of Metformin.

## 2017-07-27 NOTE — Plan of Care (Signed)
Infant transferred to the NICU for glucose management. The neonatologist, Dr. Brett Canalesavanco discussed plans for transfer with both parents. FOB accompanied the infant to NICU with the Admission Nursery. I set up a DEBP for the MOB and we initiated pumping. I instructed the MOB to pump every 3 hours as much as possible in order to protect and stimulate her breast milk supply. Patient verbalized understanding.

## 2017-07-27 NOTE — Anesthesia Postprocedure Evaluation (Signed)
Anesthesia Post Note  Patient: Alison Rowe  Procedure(s) Performed: AN AD HOC LABOR EPIDURAL     Patient location during evaluation: Mother Baby Anesthesia Type: Epidural Level of consciousness: awake, awake and alert and oriented Pain management: pain level controlled Vital Signs Assessment: post-procedure vital signs reviewed and stable Respiratory status: spontaneous breathing, respiratory function stable and nonlabored ventilation Cardiovascular status: stable Postop Assessment: no headache, no backache, patient able to bend at knees, no apparent nausea or vomiting and adequate PO intake Anesthetic complications: no    Last Vitals:  Vitals:   07/27/17 0600 07/27/17 0834  BP: 127/83 119/76  Pulse: 97 87  Resp: 19 20  Temp: 37.1 C 37.3 C  SpO2:      Last Pain:  Vitals:   07/27/17 0844  TempSrc:   PainSc: 6    Pain Goal: Patients Stated Pain Goal: 4 (07/27/17 0844)               Truitt LeepHYMER,Carime Dinkel

## 2017-07-28 LAB — CBC
HCT: 27.3 % — ABNORMAL LOW (ref 36.0–46.0)
HEMOGLOBIN: 8.9 g/dL — AB (ref 12.0–15.0)
MCH: 29.8 pg (ref 26.0–34.0)
MCHC: 32.6 g/dL (ref 30.0–36.0)
MCV: 91.3 fL (ref 78.0–100.0)
Platelets: 220 10*3/uL (ref 150–400)
RBC: 2.99 MIL/uL — AB (ref 3.87–5.11)
RDW: 14.1 % (ref 11.5–15.5)
WBC: 14.2 10*3/uL — ABNORMAL HIGH (ref 4.0–10.5)

## 2017-07-28 MED ORDER — FERROUS SULFATE 325 (65 FE) MG PO TABS
325.0000 mg | ORAL_TABLET | Freq: Two times a day (BID) | ORAL | Status: DC
  Administered 2017-07-29: 325 mg via ORAL
  Filled 2017-07-28: qty 1

## 2017-07-28 MED ORDER — POLYETHYLENE GLYCOL 3350 17 G PO PACK
17.0000 g | PACK | Freq: Every day | ORAL | Status: DC
  Administered 2017-07-29: 17 g via ORAL
  Filled 2017-07-28 (×3): qty 1

## 2017-07-28 MED ORDER — OXYCODONE HCL 5 MG PO TABS
5.0000 mg | ORAL_TABLET | ORAL | Status: DC | PRN
  Administered 2017-07-28 – 2017-07-29 (×5): 5 mg via ORAL
  Filled 2017-07-28 (×5): qty 1

## 2017-07-28 MED ORDER — GLYBURIDE 5 MG PO TABS
5.0000 mg | ORAL_TABLET | Freq: Every day | ORAL | Status: DC
  Administered 2017-07-29: 5 mg via ORAL
  Filled 2017-07-28 (×3): qty 1

## 2017-07-28 MED ORDER — METFORMIN HCL 500 MG PO TABS
1000.0000 mg | ORAL_TABLET | Freq: Two times a day (BID) | ORAL | Status: DC
  Filled 2017-07-28: qty 2

## 2017-07-28 NOTE — Lactation Note (Signed)
This note was copied from a baby's chart. Lactation Consultation Note  Patient Name: Alison Rowe  Breastfeeding consultation services and Providing Breastmilk For Your Baby in NICU given to mom.  Baby was admitted to NICU for hypoglycemia and fever.  Vaginal delivery with severe shoulder dystocia resulted in a fractured right humerus.  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  Mom has been pumping and obtaining small amounts of colostrum.  [facility]  Discussed milk coming to volume.  Encouraged to call for assist/concerns prn.   Maternal Data    Feeding Feeding Type: Formula Length of feed: 20 min  LATCH Score                   Interventions    Lactation Tools Discussed/Used     Consult Status      Huston FoleyMOULDEN, Alison Rowe, Alison Rowe

## 2017-07-28 NOTE — Progress Notes (Signed)
POSTPARTUM PROGRESS NOTE  Post Partum Day 1 Subjective:  Alison Rowe is a 31 y.o. G2P2002 567w0d s/p vacuum extracted VBAC.  No acute events overnight.  Patient complaints of feeling a little dizzy when standing earlier today.   Objective: Blood pressure 119/82, pulse 75, temperature (!) 97.5 F (36.4 C), temperature source Oral, resp. rate 18, height 5\' 8"  (1.727 m), weight 243 lb (110.2 kg), last menstrual period 11/10/2016, SpO2 99 %, unknown if currently breastfeeding.  Physical Exam:  General: alert, cooperative and no distress Lochia:normal flow Chest: no respiratory distress Heart:regular rate, distal pulses intact Abdomen: soft, nontender,  Uterine Fundus: firm, appropriately tender DVT Evaluation: No calf swelling or tenderness Extremities: no edema  Recent Labs    07/27/17 0623 07/28/17 0849  HGB 10.3* 8.9*  HCT 30.1* 27.3*    Assessment/Plan:  ASSESSMENT: Alison Rowe is a 31 y.o. G2P2002 4767w0d s/p vacuum extracted VBAC. #DM2- was previously on metformin (1000 mg BID) and glyberide (unknown dose). Was taking insulin during pregnancy. Restart metformin at previous dose postpartum and glyberide 5 mg daily. CBC this am showed hgb 8.9.  Plan for discharge tomorrow   LOS: 2 days   Gaynor Ferreras MossMD 07/28/2017, 11:49 AM

## 2017-07-29 LAB — GLUCOSE, CAPILLARY: GLUCOSE-CAPILLARY: 141 mg/dL — AB (ref 65–99)

## 2017-07-29 MED ORDER — FERROUS SULFATE 325 (65 FE) MG PO TABS: 325.0000 mg | ORAL_TABLET | Freq: Two times a day (BID) | ORAL | 3 refills | Status: DC

## 2017-07-29 MED ORDER — OXYCODONE HCL 5 MG PO TABS: 5.0000 mg | ORAL_TABLET | Freq: Four times a day (QID) | ORAL | 0 refills | Status: DC | PRN

## 2017-07-29 MED ORDER — GLYBURIDE 5 MG PO TABS: 10.0000 mg | ORAL_TABLET | Freq: Every day | ORAL | 1 refills | Status: DC

## 2017-07-29 NOTE — Progress Notes (Signed)
CBG was 141 @0914  this was taken after she had eaten breakfast @0830 .

## 2017-07-29 NOTE — Discharge Instructions (Signed)

## 2017-07-29 NOTE — Discharge Summary (Signed)
OB Discharge Summary     Patient Name: Alison Rowe DOB: [birth year] MRN: [id number] Date of admission: 07/25/2017  Delivering MD: Rolm BookbinderMOSS, Rob Mciver )  Date of discharge: 07/29/2017    Admitting diagnosis: pregnancy at [redacted] weeks gestation completed, SROM, history of cesarean section Intrauterine pregnancy: 2231w0d    Secondary diagnosis:  Active Problems:   Patient Active Problem List   Diagnosis Date Noted  . VBAC (vaginal birth after Cesarean) 07/27/2017  . Pregnancy 07/26/2017  . GBS (group B Streptococcus carrier), +RV culture, currently pregnant 07/21/2017  . LGA (large for gestational age) fetus affecting management of mother 06/22/2017  . Supervision of high risk pregnancy, antepartum 01/27/2017  . Type 2 diabetes mellitus in pregnancy 01/27/2017  . History of cesarean section complicating pregnancy 01/27/2017    Additional problems: none     Discharge diagnosis: VBAC                                                                                                Post partum procedures:none  Complications: Hemorrhage>102200mL  Hospital course:  Induction of Labor With Vaginal Delivery   31 y.o. yo G2P2002 at 7131w0d was admitted to the hospital 07/25/2017 for SROM. Patient had an  labor course as follows: Membrane Rupture Time/Date: 11:00 PM ,07/25/2017   Intrapartum Procedures: Episiotomy: None [1]                                         Lacerations:  3rd degree [4];Perineal [11]  Patient had delivery of a Viable infant.  Information for the patient's newborn:  Jerrol Bananaguilera, Boy Natalie [[id number]][[id number]]  Delivery Method: VBAC, Vacuum Assisted(Filed from Delivery Summary)   07/27/2017  Details of delivery can be found in separate delivery note.  Patient had a routine postpartum course. Patient is discharged home 07/29/17.  Physical exam  Vitals:   07/28/17 0826 07/28/17 1922  BP: 119/82 122/67  Pulse: 75 100  Resp: 18 18  Temp: (!) 97.5 F (36.4 C) 97.9 F (36.6 C)  SpO2: 99%      General: alert, cooperative and no distress Lochia: appropriate Uterine Fundus: firm Incision: N/A DVT Evaluation: No evidence of DVT seen on physical exam.  Labs: Results for orders placed or performed during the hospital encounter of 07/25/17 (from the past 24 hour(s))  Glucose, capillary     Status: Abnormal   Collection Time: 07/29/17  9:11 AM  Result Value Ref Range   Glucose-Capillary 141 (H) 65 - 99 mg/dL     Discharge instruction: per After Visit Summary and "Baby and Me Booklet".  After visit meds:  No Known Allergies  Allergies as of 07/29/2017   No Known Allergies     Medication List    STOP taking these medications   accu-chek multiclix lancets   ASPIRIN LOW DOSE 81 MG chewable tablet Generic drug:  aspirin   calcium carbonate 500 MG chewable tablet Commonly known as:  TUMS - dosed in mg elemental calcium   glucose blood test strip Commonly known as:  ACCU-CHEK AVIVA PLUS   hydrOXYzine 50 MG tablet Commonly known as:  ATARAX/VISTARIL   insulin aspart 100 UNIT/ML FlexPen Commonly known as:  NOVOLOG FLEXPEN   insulin NPH Human 100 UNIT/ML injection Commonly known as:  NOVOLIN N   Insulin Pen Needle 31G X 8 MM Misc   INSULIN SYRINGE .5CC/31GX5/16" 31G X 5/16" 0.5 ML Misc     TAKE these medications   acetaminophen 500 MG tablet Commonly known as:  TYLENOL Take 500 mg every 6 (six) hours as needed by mouth for mild pain or headache.   ferrous sulfate 325 (65 FE) MG tablet Take 1 tablet (325 mg total) 2 (two) times daily with a meal by mouth.   glyBURIDE 5 MG tablet Commonly known as:  DIABETA Take 2 tablets (10 mg total) daily with breakfast by mouth.   metFORMIN 1000 MG tablet Commonly known as:  GLUCOPHAGE Take 1 tablet (1,000 mg total) by mouth 2 (two) times daily.   PREPLUS 27-1 MG Tabs TAKE 1 TABLET BY MOUTH DAILY        Diet: carb modified diet  Activity: Advance as tolerated. Pelvic rest for 6 weeks.   Outpatient  follow up:6 weeks Future Appointments: No future appointments.  Follow up Appt: No Follow-up on file.     Postpartum contraception: IUD  Newborn Data: APGAR (1 MIN): 7   APGAR (5 MINS): 9   APGAR (10 MINS):   @[id number] @   Baby Feeding: Breast Disposition:home with mother  Rolm Bookbindermber Mihira Tozzi, DO  07/29/2017

## 2017-07-29 NOTE — Progress Notes (Signed)
Patient screened out for psychosocial assessment since none of the following apply:  Psychosocial stressors documented in mother or baby's chart  Gestation less than 32 weeks  Code at delivery   Infant with anomalies Please contact the Clinical Social Worker if specific needs arise, or by MOB's request.   Haddon Fyfe Boyd-Gilyard, MSW, LCSW Clinical Social Work [phone]  

## 2017-07-29 NOTE — Lactation Note (Signed)
This note was copied from a baby's chart. Lactation Consultation Note  Patient Name: Boy Aaleigha Bozza ITGPQ'D Date: 07/29/2017 Reason for consult: Initial assessment;NICU baby  NICU baby 10 hours old. Mom reports that she is getting a few drops of colostrum when pumping. Mom states that she is pumping every 3-4 hours; however, mom has not pumped in the last 10 hours. Enc mom to pump every 2-3 hours followed by hand expression, and enc mom to take to NICU for the baby. Mom reports that she is active with [facility] and has an appointment to get a pump next week. Mom given paperwork for [facility] loaner, and will call when she has the money for the loaner. Enc mom to take pumping kit at D/C, and mom aware of pumping rooms in the NICU. Mom aware of OP/BFSG and [place] phone line assistance after D/C.   Maternal Data Has patient been taught Hand Expression?: Yes Does the patient have breastfeeding experience prior to this delivery?: Yes  Feeding Feeding Type: Formula Length of feed: 30 min  LATCH Score                   Interventions    Lactation Tools Discussed/Used Pump Review: Setup, frequency, and cleaning;Milk Storage Initiated by:: bedside RN Date initiated:: 07/27/17   Consult Status Consult Status: PRN    Andres Labrum 07/29/2017, 9:27 AM

## 2017-08-02 ENCOUNTER — Other Ambulatory Visit: Payer: Self-pay

## 2017-08-02 ENCOUNTER — Encounter: Payer: Self-pay | Admitting: Family Medicine

## 2017-09-09 ENCOUNTER — Encounter: Payer: Self-pay | Admitting: Certified Nurse Midwife

## 2017-09-09 ENCOUNTER — Ambulatory Visit (INDEPENDENT_AMBULATORY_CARE_PROVIDER_SITE_OTHER): Payer: Medicaid Other | Admitting: Certified Nurse Midwife

## 2017-09-09 DIAGNOSIS — Z30430 Encounter for insertion of intrauterine contraceptive device: Secondary | ICD-10-CM

## 2017-09-09 DIAGNOSIS — Z3202 Encounter for pregnancy test, result negative: Secondary | ICD-10-CM | POA: Diagnosis not present

## 2017-09-09 LAB — POCT PREGNANCY, URINE: Preg Test, Ur: NEGATIVE

## 2017-09-09 MED ORDER — LEVONORGESTREL 18.6 MCG/DAY IU IUD
INTRAUTERINE_SYSTEM | Freq: Once | INTRAUTERINE | Status: AC
  Administered 2017-09-09: 12:00:00 via INTRAUTERINE

## 2017-09-09 NOTE — Progress Notes (Signed)
   GYNECOLOGY CLINIC PROCEDURE NOTE  [name] is a 31 y.o. [phone] here for Liletta IUD insertion. No GYN concerns.  Last pap smear was on 01/17/2017 and was normal. Recent pregnancy- delivered 6 weeks ago.   IUD Insertion Procedure Note Patient identified, informed consent performed.  Discussed risks of irregular bleeding, cramping, infection, malpositioning or misplacement of the IUD outside the uterus which may require further procedure such as laparoscopy. Time out was performed.  Urine pregnancy test negative.  Speculum placed in the vagina.  Cervix visualized.  Cleaned with Betadine x 2.  Grasped anteriorly with a single tooth tenaculum.  Uterus sounded to 7 cm. First Liletta IUD placed per manufacturer's recommendations, upon removal of blood using faux swabs IUD noted to be at cervical os and removed. Second Liletta IUD placed per manufacturer's recommendations by Dr Debroah LoopArnold.  Strings trimmed to 3 cm. Tenaculum was removed, good hemostasis noted.  Patient tolerated procedure well.   Patient was given post-procedure instructions.  She was advised to be have backup contraception for one week.  Patient was also asked to check IUD strings periodically and follow up in 4 weeks for IUD check.  Sharyon CableRogers, Jalexus Brett C, CNM 09/09/2017 12:14 PM

## 2017-09-09 NOTE — Progress Notes (Signed)
Subjective:     Alison Rowe is a 31 y.o. female who presents for a postpartum visit. She is 6 weeks postpartum following a spontaneous vaginal delivery. I have fully reviewed the prenatal and intrapartum course. The delivery was 37 atgestational weeks. Outcome: vacuum assisted vaginal delivery. Anesthesia: epidural. Postpartum course has been uncomplicated. Baby's course has been uncomplicated. Baby is feeding by both breast and bottle - gerbert gentle. Bleeding pink spotting. Bowel function is normal. Bladder function is normal. Patient is not sexually active. Contraception method is IUD- Liletta to be placed today. Postpartum depression screening: negative.  The following portions of the patient's history were reviewed and updated as appropriate: allergies, current medications and problem list.  Review of Systems Pertinent items noted in HPI and remainder of comprehensive ROS otherwise negative.   Objective:    LMP 11/10/2016 (Approximate)   General:  alert, cooperative and no distress   Breasts:  inspection negative, no nipple discharge or bleeding, no masses or nodularity palpable  Lungs: clear to auscultation bilaterally  Heart:  regular rate and rhythm, S1, S2 normal, no murmur, click, rub or gallop  Abdomen: soft, non-tender; bowel sounds normal; no masses,  no organomegaly   Vulva:  normal  Vagina: vagina positive for dark red vaginal bleeding with no clots  Cervix:  moderate bleeding following IUD placement from tenaculum   Corpus: anteflexed  Adnexa:  normal adnexa  Rectal Exam: Not performed.        Assessment/Plan:   1. Encounter for IUD insertion -See procedure note - levonorgestrel (LILETTA) 18.6 MCG/DAY IUD  2. Encounter for routine postpartum follow-up - Normal postpartum exam. Pap smear not done at today's visit- last PAP 01/2017 .     Follow up in: 4 week for IUD string check or as needed.    Sharyon CableVeronica C Sheenah Dimitroff, CNM 09/09/17, 1:25 PM

## 2017-09-09 NOTE — Patient Instructions (Signed)

## 2017-09-20 ENCOUNTER — Encounter: Payer: Self-pay | Admitting: *Deleted

## 2017-10-10 ENCOUNTER — Ambulatory Visit (INDEPENDENT_AMBULATORY_CARE_PROVIDER_SITE_OTHER): Payer: Medicaid Other | Admitting: Obstetrics & Gynecology

## 2017-10-10 VITALS — BP 122/71 | HR 105 | Wt 205.0 lb

## 2017-10-10 DIAGNOSIS — Z30431 Encounter for routine checking of intrauterine contraceptive device: Secondary | ICD-10-CM

## 2017-10-10 NOTE — Progress Notes (Signed)
SVD 07/27/17. Here for string ck after IUD placement. Having some yellow d/c. No itching or pain

## 2017-10-10 NOTE — Progress Notes (Signed)
Subjective:     Patient ID: Alison Rowe, female   DOB: [birth year], 32 y.o.   MRN: [id number]  [id number] Patient's last menstrual period was 11/10/2016 (approximate). IUD placed 4 week ago for check.   Review of Systems  Constitutional: Negative.   Gastrointestinal: Negative.   Genitourinary: Negative.   Musculoskeletal: Negative.        Objective:   Physical Exam  Constitutional: She appears well-developed. No distress.  Pulmonary/Chest: Effort normal. No respiratory distress.  Genitourinary: Vaginal discharge (scant mucus at os, string 3 cm) found.  Psychiatric: She has a normal mood and affect. Her behavior is normal.  Vitals reviewed.      Assessment:     Normal IUD placement    Plan:     Routine Gyn care  Adam PhenixArnold, James G, MD 10/10/2017

## 2017-10-10 NOTE — Patient Instructions (Signed)

## 2017-10-31 ENCOUNTER — Encounter: Payer: Self-pay | Admitting: Obstetrics & Gynecology

## 2017-10-31 ENCOUNTER — Ambulatory Visit (INDEPENDENT_AMBULATORY_CARE_PROVIDER_SITE_OTHER): Payer: Medicaid Other | Admitting: Obstetrics & Gynecology

## 2017-10-31 VITALS — BP 107/77 | HR 88 | Wt 210.1 lb

## 2017-10-31 DIAGNOSIS — R102 Pelvic and perineal pain unspecified side: Secondary | ICD-10-CM

## 2017-10-31 NOTE — Progress Notes (Signed)
Pt repoerts a stabbing pain depending on her sitting position, IUD strings feel longer. Husband reports something felt strange with intercourse. Has been having bleeding since 2 days after IUD placed.

## 2017-10-31 NOTE — Progress Notes (Signed)
   Subjective:    Patient ID: Alison Rowe, female    DOB: Mar 21, 1986, 32 y.o.   MRN: [id number]  HPI  32 yo married P2 (30 yo and 693 month old kids) here because her husband feels the IUD strings with sex and it is painful. She also feels some stabbing pain in her vagina when she sits up.  Review of Systems HM PAP SMEAR  Order: [id number]  Status:  Final result Visible to patient:  Yes (MyChart) Next appt:  None   Ref Range & Units 48mo ago  Pap Negative for intraephithelial lesion or malignancy, Other Negative for intraephithelial lesion or malignancy            Objective:   Physical Exam Breathing, conversing, and ambulating normally Well nourished, well hydrated White female, no apparent distress Stiff Liletta strings about 2 1/2 cm long, very "pokey" I offered to cut the strings flush with the cervix and she opted for this.       Assessment & Plan:  Husband pain with sex- strings cut flush I will order an ultrasound to confirm that it is in the correct position

## 2017-11-03 ENCOUNTER — Ambulatory Visit (HOSPITAL_COMMUNITY): Payer: Medicaid Other

## 2019-07-12 IMAGING — US US MFM OB FOLLOW-UP
1 series · 14 of 25 positions shown · non-contrast
Comparison: none

[Series 1: us mfm ob follow-up · 25 acquisitions, 14 frames shown]
[im 1/25]
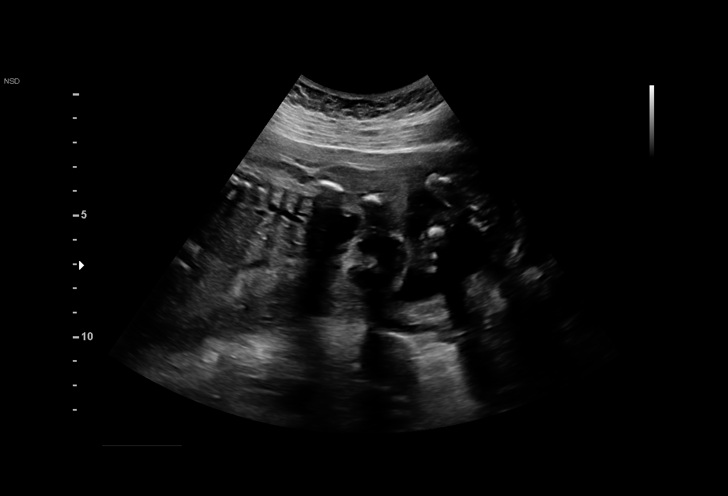
[im 3/25]
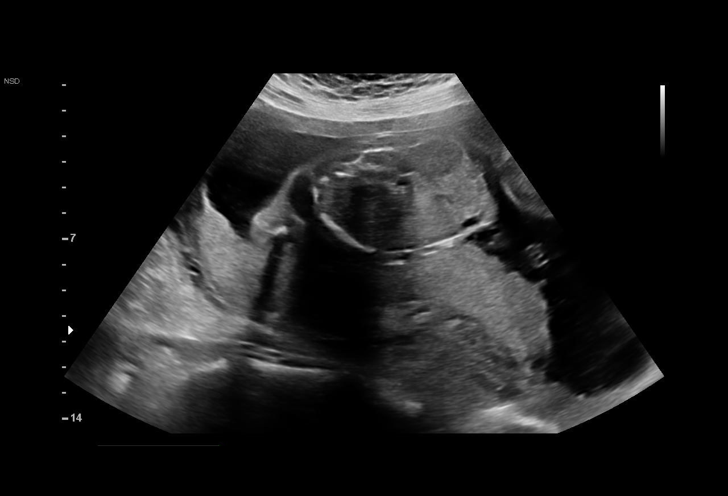
[im 5/25]
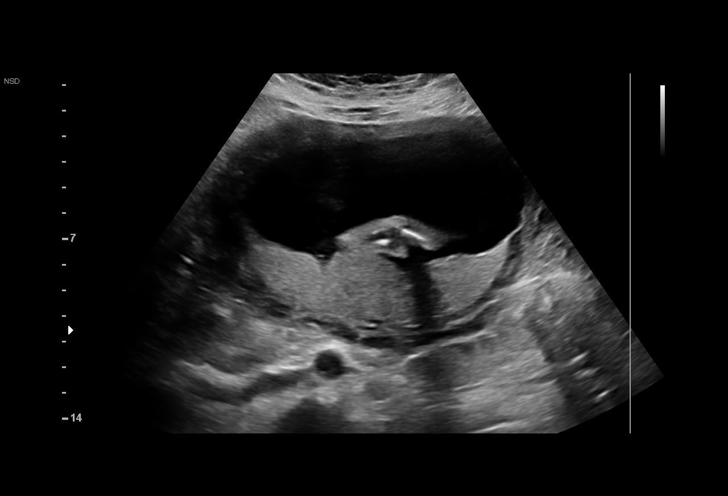
[im 7/25]
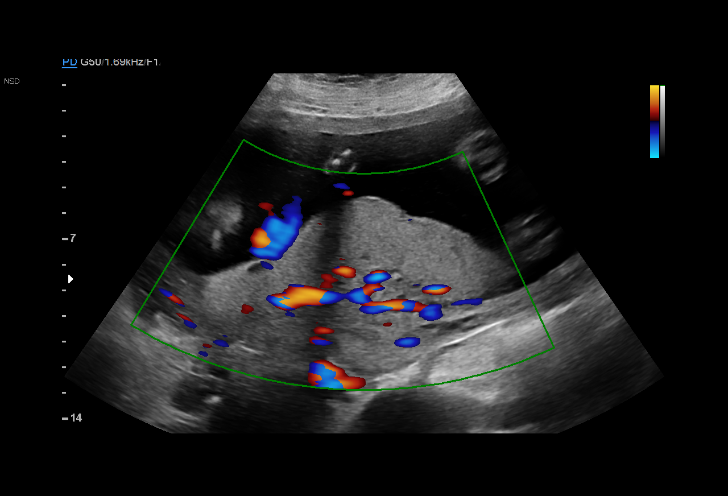
[im 9/25]
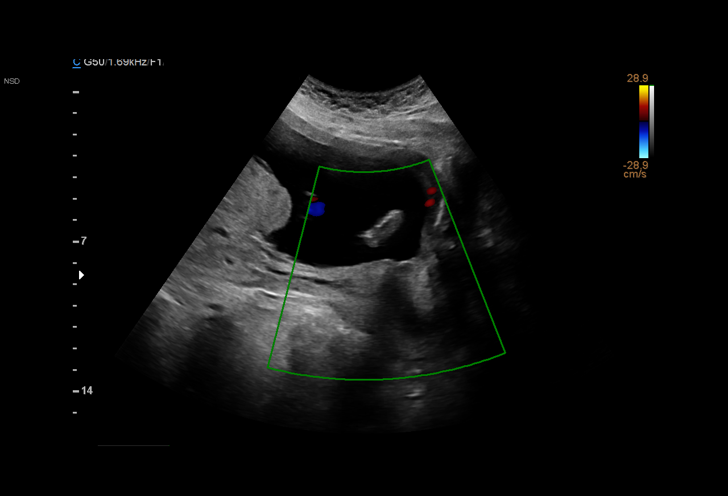
[im 10/25]
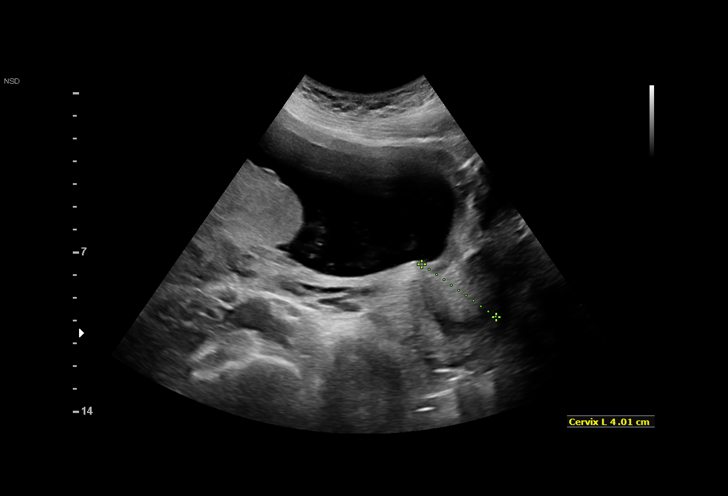
[im 12/25]
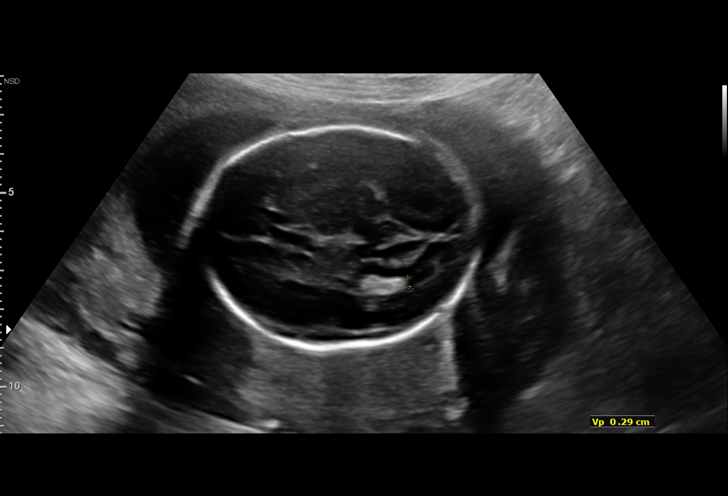
[im 14/25]
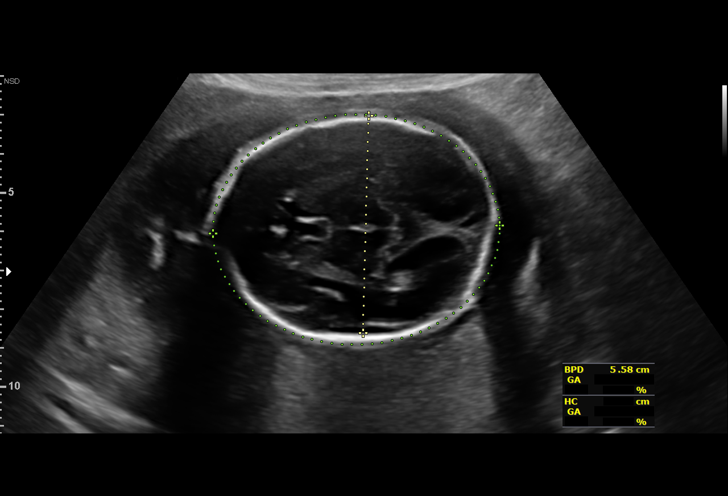
[im 16/25]
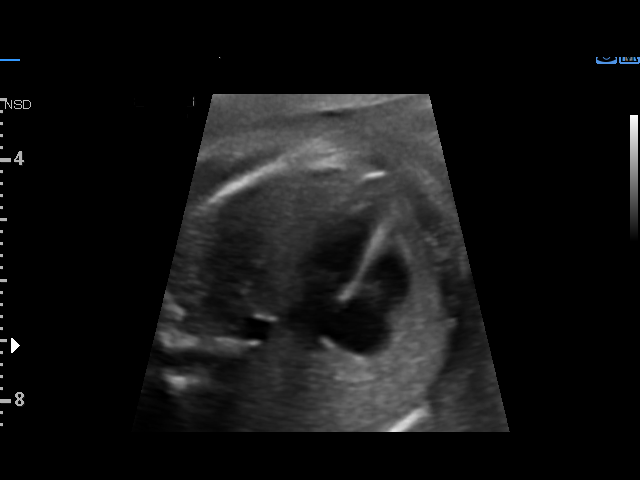
[im 17/25]
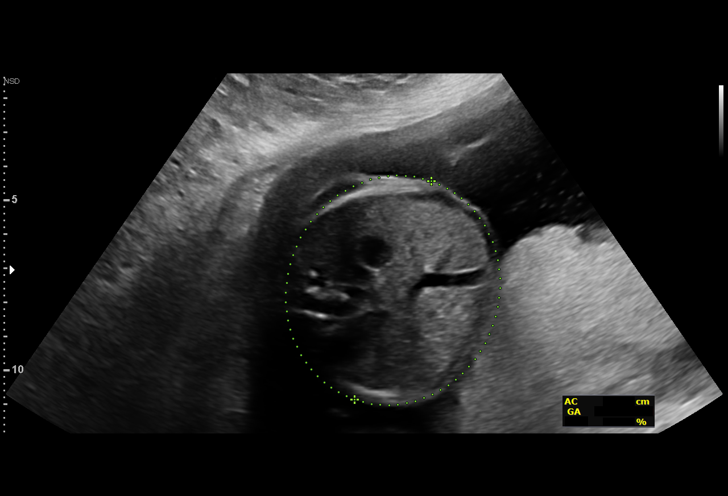
[im 19/25]
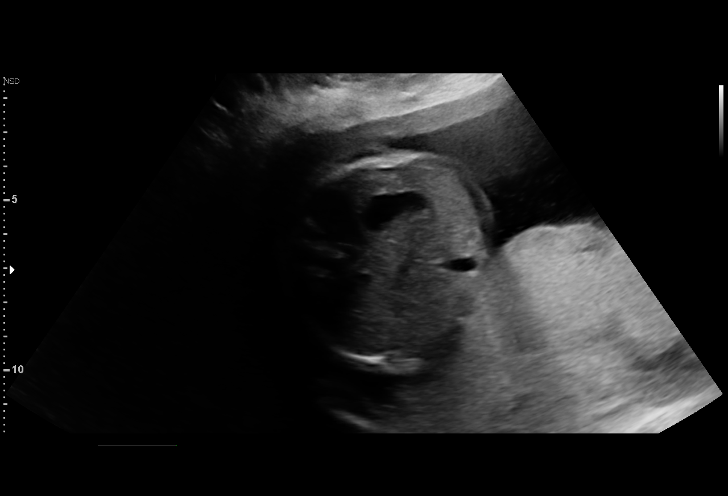
[im 21/25]
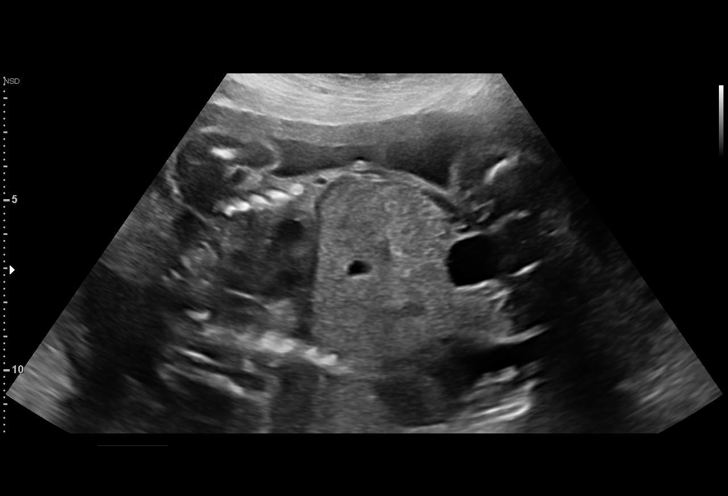
[im 23/25]
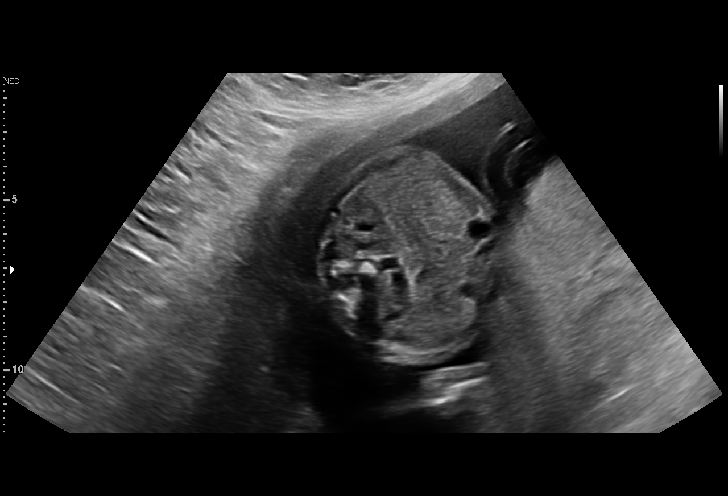
[im 25/25]
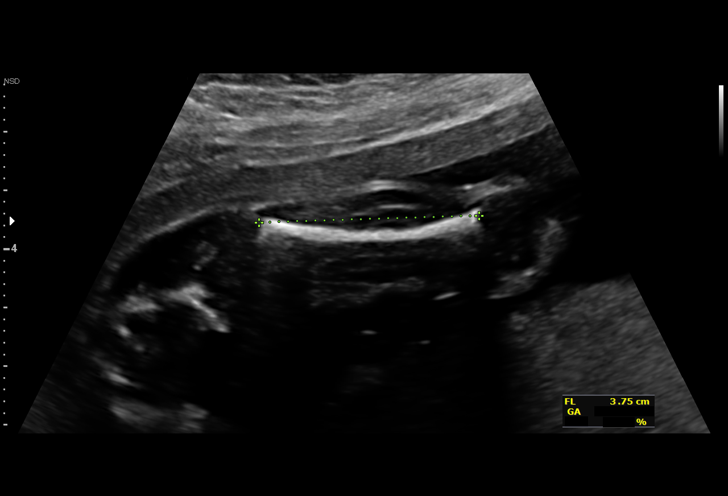

[14 of 25 positions shown; findings below may reference images not displayed]

OB/Gyn Clinic
[REDACTED]

1  MYRISH MONJE            [id number]      [id number]     [id number]
Indications

23 weeks gestation of pregnancy
Encounter for antenatal screening for
malformations
Obesity complicating pregnancy, second
trimester
Poor obstetric history: Previous gestational
diabetes
History of cesarean delivery, currently
pregnant
Pre-existing diabetes, type 2, in pregnancy,
second trimester( Insulin); normal ECHO
OB History

Blood Type:            Height:  5'8"   Weight (lb):  204       BMI:
Gravidity:    2         Term:   1        Prem:   0         SAB:   0
TOP:          0       Ectopic:  0        Living: 1
Fetal Evaluation

Num Of Fetuses:     1
Fetal Heart         142
Rate(bpm):
Cardiac Activity:   Observed
Presentation:       Breech
Placenta:           Posterior, above cervical os
P. Cord Insertion:  Previously Visualized

Amniotic Fluid
AFI FV:      Subjectively within normal limits

Largest Pocket(cm)
6.8
Biometry

BPD:      55.8  mm     G. Age:  23w 0d         46  %    CI:         70.88  %    70 - 86
FL/HC:       17.8  %    19.2 -
HC:      211.2  mm     G. Age:  23w 2d         43  %    HC/AC:       1.04       1.05 -
AC:      203.6  mm     G. Age:  25w 0d         92  %    FL/BPD:      67.4  %    71 - 87
FL:       37.6  mm     G. Age:  22w 0d         13  %    FL/AC:       18.5  %    20 - 24

Est. FW:     611   gm     1 lb 6 oz     63  %
Gestational Age

LMP:           23w 0d        Date:  11/10/16                 EDD:    08/17/17
U/S Today:     23w 2d                                        EDD:    08/15/17
Best:          23w 0d     Det. By:  LMP  (11/10/16)          EDD:    08/17/17
Anatomy

Cranium:               Previously seen        Aortic Arch:            Previously seen
Cavum:                 Previously seen        Ductal Arch:            Previously seen
Ventricles:            Appears normal         Diaphragm:              Appears normal
Choroid Plexus:        Previously seen        Stomach:                Appears normal, left
sided
Cerebellum:            Previously seen        Abdomen:                Appears normal
Posterior Fossa:       Previously seen        Abdominal Wall:         Appears nml (cord
insert, abd wall)
Nuchal Fold:           Previously seen        Cord Vessels:           Previously seen
Face:                  Orbits and profile     Kidneys:                Appear normal
previously seen
Lips:                  Previously seen        Bladder:                Appears normal
Thoracic:              Appears normal         Spine:                  Previously seen
Heart:                 Appears normal         Upper Extremities:      Previously seen
(4CH, axis, and
situs)
RVOT:                  Previously seen        Lower Extremities:      Previously seen
LVOT:                  Previously seen

Other:  Male gender previously seen. Heels and 5th digit previously
visualized.
Cervix Uterus Adnexa

Cervix
Length:              4  cm.
Normal appearance by transabdominal scan.
Impression

SIUP at 23+0 weeks
Normal interval anatomy; anatomic survey complete
Normal amniotic fluid volume
Appropriate interval growth with EFW at the 63rd %tile
Recommendations

Follow-up ultrasound for growth in 4 weeks (T2 DM)

## 2019-08-29 IMAGING — US US MFM OB LIMITED
1 series · 15 of 22 positions shown · non-contrast
Comparison: none

[Series 1: us mfm ob limited · 15 of 22 slices shown]
[im 1/22]
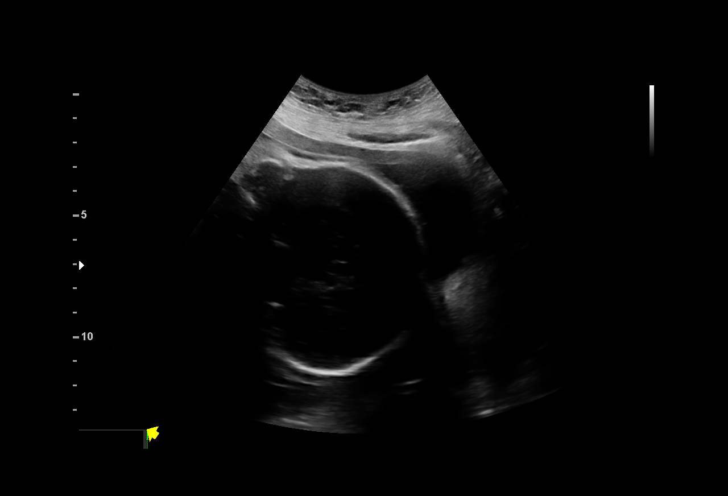
[im 3/22]
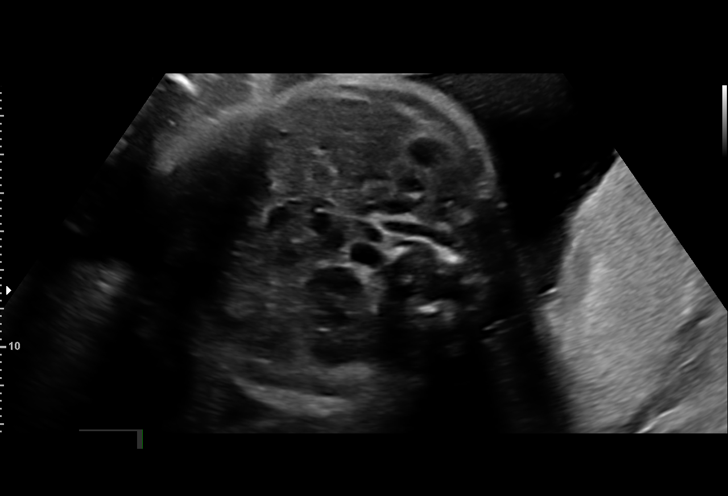
[im 4/22]
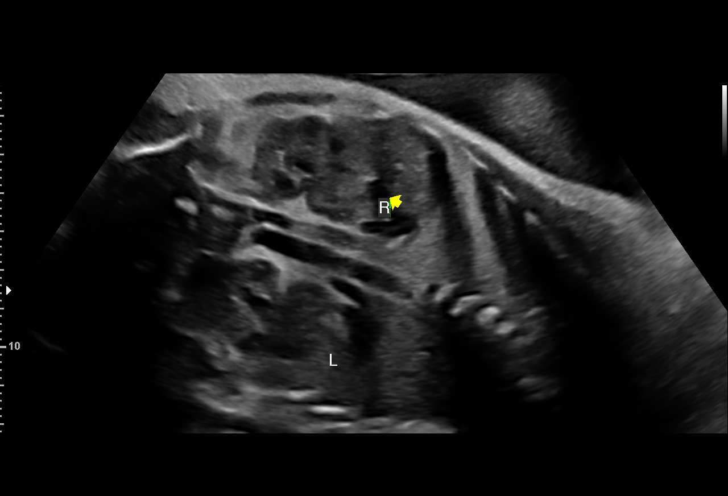
[im 6/22]
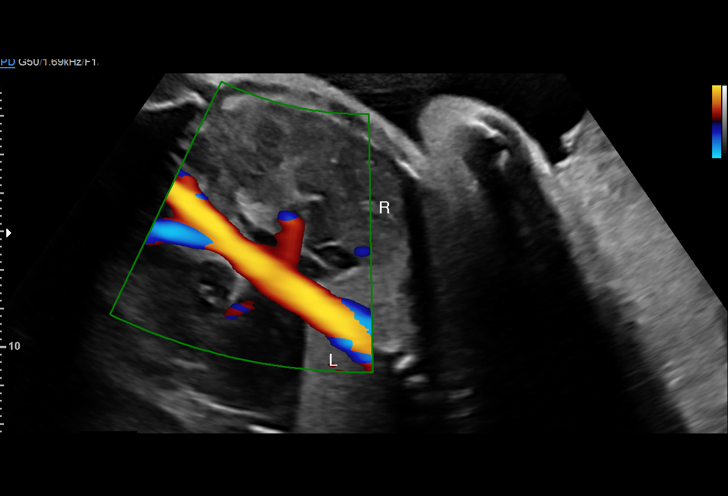
[im 7/22]
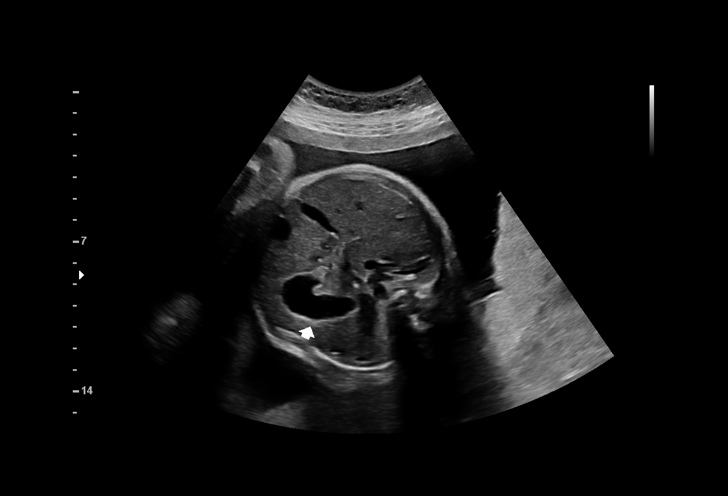
[im 9/22]
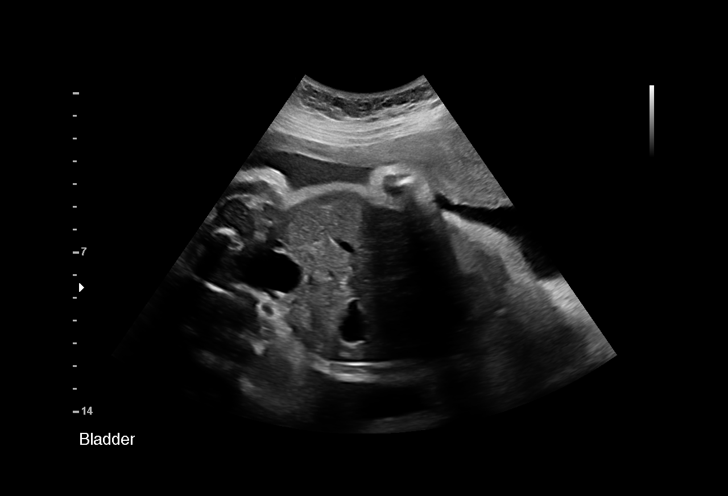
[im 10/22]
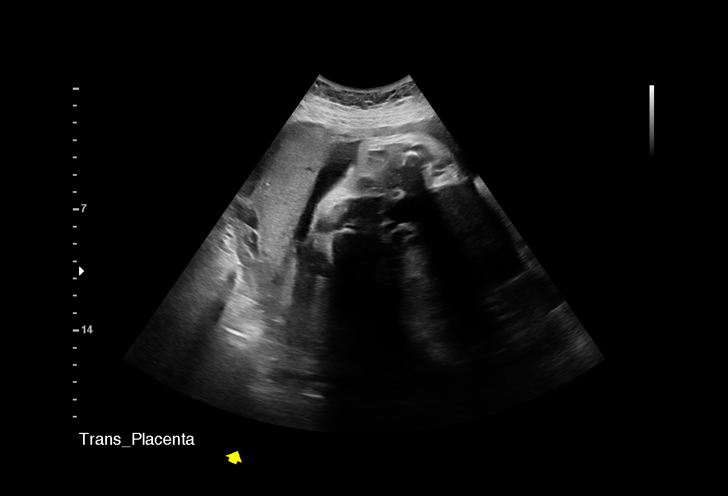
[im 12/22]
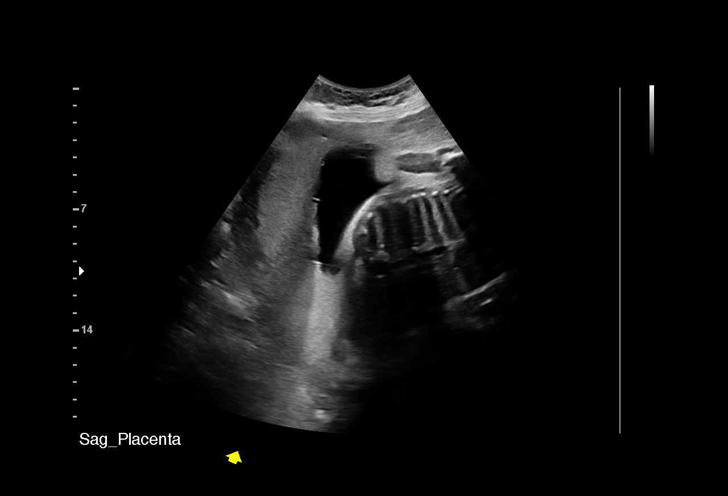
[im 13/22]
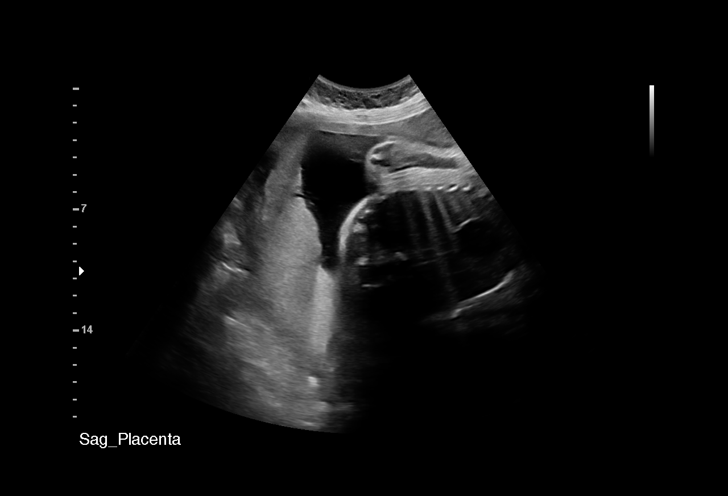
[im 14/22]
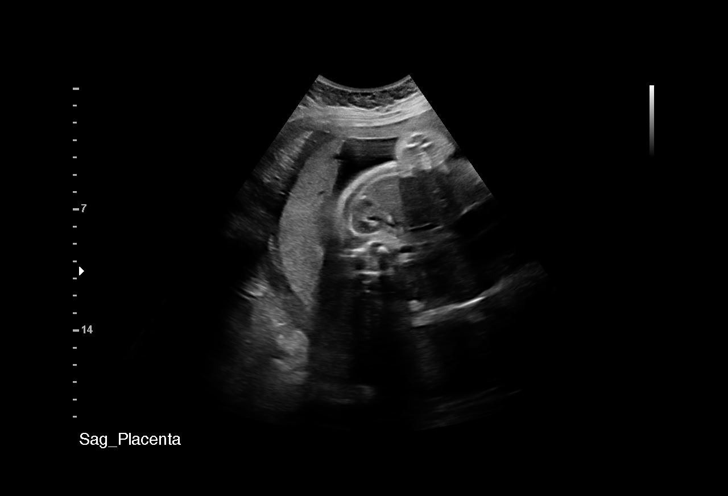
[im 16/22]
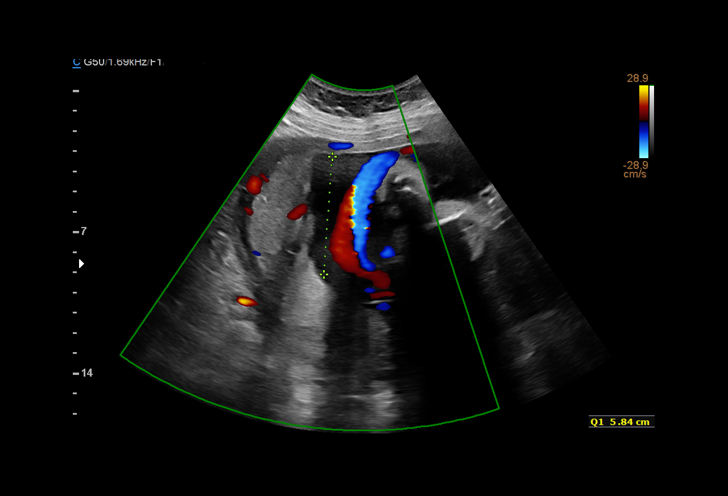
[im 17/22]
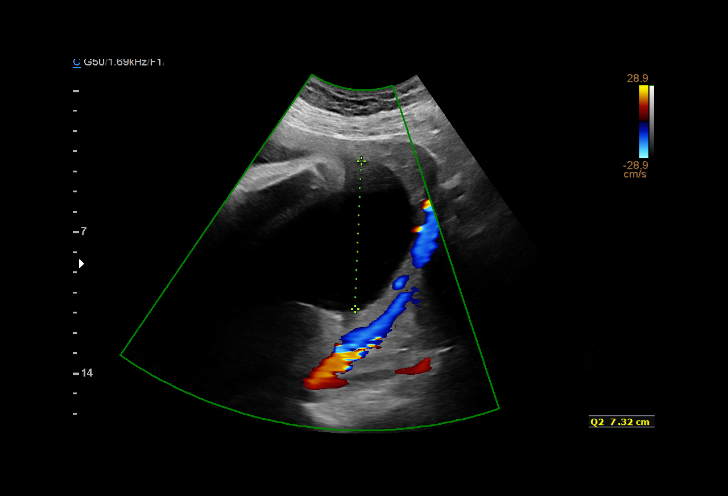
[im 19/22]
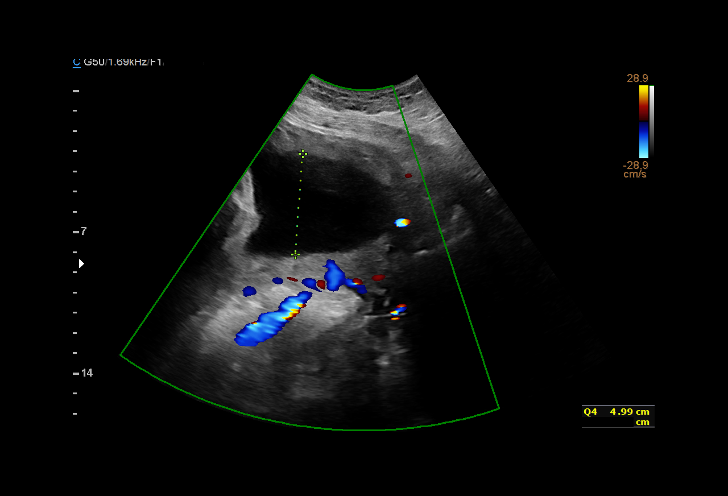
[im 20/22]
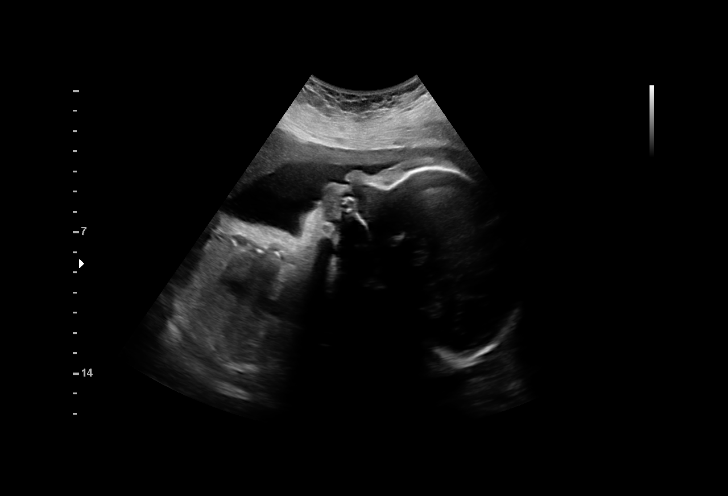
[im 22/22]
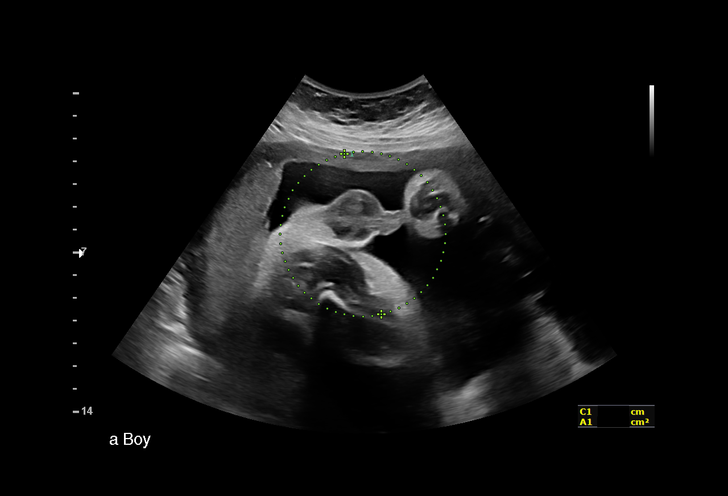

[15 of 22 positions shown; findings below may reference images not displayed]

OB/Gyn Clinic
[REDACTED]

1  RADHAMES DAUGHERTY              [id number]      [id number]     [id number]
Indications

29 weeks gestation of pregnancy
History of cesarean delivery, currently
pregnant
Obesity complicating pregnancy, third
trimester
Pre-existing diabetes, type 2, in pregnancy,
third trimester (Insulin); normal ECHO
Suspected polyhydramnios not found
OB History

Blood Type:            Height:  5'8"   Weight (lb):  204      BMI:
Gravidity:    2         Term:   1        Prem:   0        SAB:   0
TOP:          0       Ectopic:  0        Living: 1
Fetal Evaluation

Num Of Fetuses:     1
Fetal Heart         137
Rate(bpm):
Cardiac Activity:   Observed
Presentation:       Cephalic
Placenta:           Posterior, above cervical os
P. Cord Insertion:  Previously Visualized
Amniotic Fluid
AFI FV:      Subjectively upper-normal

AFI Sum(cm)     %Tile       Largest Pocket(cm)
24.6            96

RUQ(cm)       RLQ(cm)       LUQ(cm)        LLQ(cm)
5.84
Gestational Age

LMP:           29w 6d       Date:   11/10/16                 EDD:   08/17/17
Best:          29w 6d    Det. By:   LMP  (11/10/16)          EDD:   08/17/17
Anatomy

Stomach:               Appears normal, left   Bladder:                Appears normal
sided
Kidneys:               Appear normal
Cervix Uterus Adnexa

Cervix
Not visualized (advanced GA >69wks)
Impression

Single living intrauterine pregnancy at
Amniotic fluid volume is at the upper limits of normal with an
AFI of 24.6 cm.
Recommendations

Recommend follow-up ultrasound examination in 2 weeks to
reassess fetal growth and amniotic fluid volume.

## 2021-11-05 ENCOUNTER — Encounter: Payer: Self-pay | Admitting: Obstetrics

## 2021-11-05 ENCOUNTER — Encounter: Payer: Medicaid Other | Admitting: Obstetrics

## 2021-12-15 ENCOUNTER — Ambulatory Visit (INDEPENDENT_AMBULATORY_CARE_PROVIDER_SITE_OTHER): Payer: Medicaid Other | Admitting: Obstetrics

## 2021-12-15 ENCOUNTER — Encounter: Payer: Self-pay | Admitting: Obstetrics

## 2021-12-15 ENCOUNTER — Other Ambulatory Visit (HOSPITAL_COMMUNITY)
Admission: RE | Admit: 2021-12-15 | Discharge: 2021-12-15 | Disposition: A | Discharge status: Home / Self Care | Payer: Medicaid Other | Source: Ambulatory Visit | Attending: Obstetrics | Admitting: Obstetrics

## 2021-12-15 VITALS — BP 118/79 | HR 87 | Wt 195.0 lb

## 2021-12-15 DIAGNOSIS — Z01419 Encounter for gynecological examination (general) (routine) without abnormal findings: Secondary | ICD-10-CM

## 2021-12-15 DIAGNOSIS — Z3009 Encounter for other general counseling and advice on contraception: Secondary | ICD-10-CM

## 2021-12-15 DIAGNOSIS — E139 Other specified diabetes mellitus without complications: Secondary | ICD-10-CM

## 2021-12-15 DIAGNOSIS — L700 Acne vulgaris: Secondary | ICD-10-CM

## 2021-12-15 DIAGNOSIS — Z30016 Encounter for initial prescription of transdermal patch hormonal contraceptive device: Secondary | ICD-10-CM

## 2021-12-15 DIAGNOSIS — Z30432 Encounter for removal of intrauterine contraceptive device: Secondary | ICD-10-CM | POA: Diagnosis not present

## 2021-12-15 DIAGNOSIS — Z0000 Encounter for general adult medical examination without abnormal findings: Secondary | ICD-10-CM

## 2021-12-15 DIAGNOSIS — N946 Dysmenorrhea, unspecified: Secondary | ICD-10-CM

## 2021-12-15 MED ORDER — IBUPROFEN 800 MG PO TABS: 800.0000 mg | ORAL_TABLET | Freq: Three times a day (TID) | ORAL | 5 refills | Status: DC | PRN

## 2021-12-15 MED ORDER — XULANE 150-35 MCG/24HR TD PTWK: 1.0000 | MEDICATED_PATCH | TRANSDERMAL | 12 refills | Status: DC

## 2021-12-15 NOTE — Progress Notes (Signed)
? ?Subjective: ? ? ?  ?  ? Alison Rowe is a 35 y.o. female here for a routine exam.  Current complaints: Desires removal of IUD.  Periods are heavier and she has developed more acne..   ? ?Personal health questionnaire:  ?Is patient Ashkenazi Jewish, have a family history of breast and/or ovarian cancer: no ?Is there a family history of uterine cancer diagnosed at age < 67, gastrointestinal cancer, urinary tract cancer, family member who is a Personnel officer syndrome-associated carrier: no ?Is the patient overweight and hypertensive, family history of diabetes, personal history of gestational diabetes, preeclampsia or PCOS: no ?Is patient over 60, have PCOS,  family history of premature CHD under age 16, diabetes, smoke, have hypertension or peripheral artery disease:  no ?At any time, has a partner hit, kicked or otherwise hurt or frightened you?: no ?Over the past 2 weeks, have you felt down, depressed or hopeless?: no ?Over the past 2 weeks, have you felt little interest or pleasure in doing things?:no ? ? ?Gynecologic History ?Patient's last menstrual period was 11/24/2021. ?Contraception: IUD ?Last Pap: 2021. Results were: normal ?Last mammogram: n/a. Results were: n/a ? ?Obstetric History ?OB History  ?Gravida Para Term Preterm AB Living  ?2 2 2  0 0 2  ?SAB IAB Ectopic Multiple Live Births  ?0 0 0 0 2  ?  ?# Outcome Date GA Lbr Len/2nd Weight Sex Delivery Anes PTL Lv  ?2 Term 07/27/17 [redacted]w[redacted]d 12:43 / 04:08 9 lb 12.4 oz (4.435 kg) M VBAC, Vacuum EPI  LIV  ?1 Term 07/06/12 [redacted]w[redacted]d  7 lb 9 oz (3.43 kg) F CS-Unspec   LIV  ?   Complications: Gestational diabetes  ? ? ?Past Medical History:  ?Diagnosis Date  ? Gestational diabetes 2013  ? Heart murmur   ? Hyperlipidemia   ? Post partum depression 2013  ? Type II diabetes mellitus (HCC)   ?  ?Past Surgical History:  ?Procedure Laterality Date  ? BREAST BIOPSY Right 12/11/2015  ? chronic abscess incision and drainage, incisional biopsy  ? CESAREAN SECTION  2013  ? EVACUATION  BREAST HEMATOMA Right 10/26/2015  ? Procedure: IRRIGATION AND DEBRIDEMENT BREAST ;  Surgeon: 12/24/2015, MD;  Location: [facility] OR;  Service: General;  Laterality: Right;  ? INCISION AND DRAINAGE ABSCESS Right 12/11/2015  ? Procedure: INCISION AND DRAINAGE RIGHT BREAST ABSCESS;  Surgeon: 12/13/2015, MD;  Location: [facility] OR;  Service: General;  Laterality: Right;  ? LAPAROSCOPIC CHOLECYSTECTOMY  11/2013  ?  ? ?Current Outpatient Medications:  ?  ibuprofen (ADVIL) 800 MG tablet, Take 1 tablet (800 mg total) by mouth every 8 (eight) hours as needed., Disp: 30 tablet, Rfl: 5 ?  norelgestromin-ethinyl estradiol 12/2013) 150-35 MCG/24HR transdermal patch, Place 1 patch onto the skin once a week., Disp: 3 patch, Rfl: 12 ?  acetaminophen (TYLENOL) 500 MG tablet, Take 500 mg every 6 (six) hours as needed by mouth for mild pain or headache.  (Patient not taking: Reported on 12/15/2021), Disp: , Rfl:  ?  Prenatal Vit-Fe Fumarate-FA (PREPLUS) 27-1 MG TABS, TAKE 1 TABLET BY MOUTH DAILY (Patient not taking: Reported on 12/15/2021), Disp: , Rfl: 11 ?No Known Allergies  ?Social History  ? ?Tobacco Use  ? Smoking status: Former  ?  Packs/day: 0.12  ?  Years: 1.00  ?  Pack years: 0.12  ?  Types: Cigarettes  ?  Quit date: 12/08/2009  ?  Years since quitting: 12.0  ? Smokeless tobacco: Never  ?Substance Use Topics  ?  Alcohol use: No  ?  Alcohol/week: 4.0 standard drinks  ?  Types: 4 Shots of liquor per week  ?  ?Family History  ?Problem Relation Age of Onset  ? Diabetes Mother   ? Diabetes Father   ? Hyperlipidemia Father   ? Hypertension Father   ? Diabetes Maternal Grandmother   ?  ? ? ?Review of Systems ? ?Constitutional: negative for fatigue and weight loss ?Respiratory: negative for cough and wheezing ?Cardiovascular: negative for chest pain, fatigue and palpitations ?Gastrointestinal: negative for abdominal pain and change in bowel habits ?Musculoskeletal:negative for myalgias ?Neurological: negative for gait problems and  tremors ?Behavioral/Psych: negative for abusive relationship, depression ?Endocrine: negative for temperature intolerance    ?Genitourinary:negative for abnormal menstrual periods, genital lesions, hot flashes, sexual problems and vaginal discharge ?Integument/breast: negative for breast lump, breast tenderness, nipple discharge and skin lesion(s) ? ?  ?Objective:  ? ?    ?BP 118/79   Pulse 87   Wt 195 lb (88.5 kg)   LMP 11/24/2021   BMI 29.65 kg/m?  ?General:   Alert and no distress  ?Skin:   no rash or abnormalities  ?Lungs:   clear to auscultation bilaterally  ?Heart:   regular rate and rhythm, S1, S2 normal, no murmur, click, rub or gallop  ?Breasts:   normal without suspicious masses, skin or nipple changes or axillary nodes  ?Abdomen:  normal findings: no organomegaly, soft, non-tender and no hernia  ?Pelvis:  External genitalia: normal general appearance ?Urinary system: urethral meatus normal and bladder without fullness, nontender ?Vaginal: normal without tenderness, induration or masses ?Cervix: normal appearance ?Adnexa: normal bimanual exam ?Uterus: anteverted and non-tender, normal size  ? ?Lab Review ?Urine pregnancy test ?Labs reviewed yes ?Radiologic studies reviewed no ? ?I have spent a total of 20 minutes of face-to-face time, excluding clinical staff time, reviewing notes and preparing to see patient, ordering tests and/or medications, and counseling the patient.  ? ?Assessment:  ? ? 1. Encounter for gynecological examination with Papanicolaou smear of cervix ?Rx: ?- Cytology - PAP( [facility]) ? ?2. Dysmenorrhea ?Rx: ?- ibuprofen (ADVIL) 800 MG tablet; Take 1 tablet (800 mg total) by mouth every 8 (eight) hours as needed.  Dispense: 30 tablet; Refill: 5 ? ?3. Encounter for counseling regarding contraception ?- wants to continue OCP's but wants to switch to a different pil ? ?4. Encounter for initial prescription of transdermal patch hormonal contraceptive device ?Rx: ?-  norelgestromin-ethinyl estradiol [facility]) 150-35 MCG/24HR transdermal patch; Place 1 patch onto the skin once a week.  Dispense: 3 patch; Refill: 12 ? ?5. Acne vulgaris ?Rx: ?- Ambulatory referral to Dermatology ? 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 ?7. Routine adult health maintenance ?Rx: ?- Ambulatory referral to Internal Medicine ? ?8. Encounter for IUD removal ( see note ) ?  ?  ?Plan:  ? ? Education reviewed: calcium supplements, depression evaluation, low fat, low cholesterol diet, safe sex/STD prevention, self breast exams, and weight bearing exercise. ?Contraception: Financial risk analyst Weekly. ?Follow up in: 1 year.  ? ?Meds ordered this encounter  ?Medications  ? norelgestromin-ethinyl estradiol [facility]) 150-35 MCG/24HR transdermal patch  ?  Sig: Place 1 patch onto the skin once a week.  ?  Dispense:  3 patch  ?  Refill:  12  ? ibuprofen (ADVIL) 800 MG tablet  ?  Sig: Take 1 tablet (800 mg total) by mouth every 8 (eight) hours as needed.  ?  Dispense:  30 tablet  ?  Refill:  5  ? ? ? Brock BadHarper, Keyen Marban A, MD ?12/15/2021 8:52 AM  ? ? ? ? ? ?GYNECOLOGY OFFICE PROCEDURE NOTE ? ?Alison Rowe is a 36 y.o. [id number] here for Liletta IUD removal. No GYN concerns.  Last pap smear was in 2021 and was normal. ? ?IUD Removal  ?Patient identified, informed consent performed, consent signed.  Patient was in the dorsal lithotomy position, normal external genitalia was noted.  A speculum was placed in the patient's vagina, normal discharge was noted, no lesions. The cervix was visualized, no lesions, no abnormal discharge.  The strings of the IUD were grasped and pulled using ring forceps. The IUD was removed in its entirety.Xulane Patch  Patient tolerated the procedure well.   ? ?Patient will use Xulane Patch Weekly for contraception.  Routine preventative health maintenance measures emphasized. ? ?Brock BadHARLES A. Teandre Hamre, MD, FACOG ?Obstetrician Heritage manager& Gynecologist, Faculty  Practice ?[facility], [facility], [facility] ?12/15/21  ?

## 2021-12-15 NOTE — Progress Notes (Signed)
Pt is office for IUD removal and to discuss other options.  ?Pt had last pap with [facility], over 1 year ago.  ? ? ? ? ?

## 2021-12-17 LAB — CYTOLOGY - PAP
Comment: NEGATIVE
Diagnosis: UNDETERMINED — AB
High risk HPV: NEGATIVE

## 2022-02-01 ENCOUNTER — Telehealth: Payer: Self-pay | Admitting: *Deleted

## 2022-02-01 NOTE — Telephone Encounter (Signed)
Pt called to office stating BC patch is not sticking to her skin very well, will come off by end of day. Pt advised she may have to change BC method.  Pt states she will try a pill if approved.   Message to be sent to provider for review, pt made aware.  Please send new BC pill Rx to [facility]- [address]

## 2022-02-06 ENCOUNTER — Other Ambulatory Visit: Payer: Self-pay | Admitting: Obstetrics

## 2022-02-06 DIAGNOSIS — Z30011 Encounter for initial prescription of contraceptive pills: Secondary | ICD-10-CM

## 2022-02-06 MED ORDER — NORGESTIMATE-ETH ESTRADIOL 0.25-35 MG-MCG PO TABS: 1.0000 | ORAL_TABLET | Freq: Every day | ORAL | 11 refills | Status: AC

## 2022-02-09 ENCOUNTER — Other Ambulatory Visit: Payer: Self-pay

## 2022-02-09 ENCOUNTER — Emergency Department (HOSPITAL_COMMUNITY): Payer: Medicaid Other

## 2022-02-09 ENCOUNTER — Emergency Department (HOSPITAL_COMMUNITY)
Admission: EM | Admit: 2022-02-09 | Discharge: 2022-02-10 | Disposition: A | Discharge status: Home / Self Care | Payer: Medicaid Other | Attending: Emergency Medicine | Admitting: Emergency Medicine

## 2022-02-09 DIAGNOSIS — Z760 Encounter for issue of repeat prescription: Secondary | ICD-10-CM | POA: Diagnosis not present

## 2022-02-09 DIAGNOSIS — R739 Hyperglycemia, unspecified: Secondary | ICD-10-CM | POA: Diagnosis not present

## 2022-02-09 DIAGNOSIS — R109 Unspecified abdominal pain: Secondary | ICD-10-CM | POA: Diagnosis not present

## 2022-02-09 DIAGNOSIS — X500XXA Overexertion from strenuous movement or load, initial encounter: Secondary | ICD-10-CM | POA: Insufficient documentation

## 2022-02-09 DIAGNOSIS — S3992XA Unspecified injury of lower back, initial encounter: Secondary | ICD-10-CM | POA: Diagnosis present

## 2022-02-09 DIAGNOSIS — S39012A Strain of muscle, fascia and tendon of lower back, initial encounter: Secondary | ICD-10-CM | POA: Insufficient documentation

## 2022-02-09 DIAGNOSIS — T148XXA Other injury of unspecified body region, initial encounter: Secondary | ICD-10-CM

## 2022-02-09 LAB — URINALYSIS, ROUTINE W REFLEX MICROSCOPIC
Bilirubin Urine: NEGATIVE
Glucose, UA: >=500 mg/dL — AB
Ketones, ur: 20 mg/dL — AB
Leukocytes,Ua: NEGATIVE
Nitrite: NEGATIVE
Protein, ur: NEGATIVE mg/dL
Specific Gravity, Urine: 1.037 — ABNORMAL HIGH (ref 1.005–1.030)
pH: 5 (ref 5.0–8.0)

## 2022-02-09 LAB — CBC WITH DIFFERENTIAL/PLATELET
Abs Immature Granulocytes: 0.02 10*3/uL (ref 0.00–0.07)
Basophils Absolute: 0 10*3/uL (ref 0.0–0.1)
Basophils Relative: 1 %
Eosinophils Absolute: 0 10*3/uL (ref 0.0–0.5)
Eosinophils Relative: 1 %
HCT: 38.2 % (ref 36.0–46.0)
Hemoglobin: 14.2 g/dL (ref 12.0–15.0)
Immature Granulocytes: 0 %
Lymphocytes Relative: 33 %
Lymphs Abs: 2 10*3/uL (ref 0.7–4.0)
MCH: 35.1 pg — ABNORMAL HIGH (ref 26.0–34.0)
MCHC: 37.2 g/dL — ABNORMAL HIGH (ref 30.0–36.0)
MCV: 94.3 fL (ref 80.0–100.0)
Monocytes Absolute: 0.3 10*3/uL (ref 0.1–1.0)
Monocytes Relative: 5 %
Neutro Abs: 3.6 10*3/uL (ref 1.7–7.7)
Neutrophils Relative %: 60 %
Platelets: 336 10*3/uL (ref 150–400)
RBC: 4.05 MIL/uL (ref 3.87–5.11)
RDW: 13.4 % (ref 11.5–15.5)
WBC: 5.9 10*3/uL (ref 4.0–10.5)
nRBC: 0 % (ref 0.0–0.2)

## 2022-02-09 LAB — I-STAT CHEM 8, ED
BUN: 7 mg/dL (ref 6–20)
Calcium, Ion: 1.11 mmol/L — ABNORMAL LOW (ref 1.15–1.40)
Chloride: 105 mmol/L (ref 98–111)
Creatinine, Ser: 0.3 mg/dL — ABNORMAL LOW (ref 0.44–1.00)
Glucose, Bld: 241 mg/dL — ABNORMAL HIGH (ref 70–99)
HCT: 40 % (ref 36.0–46.0)
Hemoglobin: 13.6 g/dL (ref 12.0–15.0)
Potassium: 4 mmol/L (ref 3.5–5.1)
Sodium: 133 mmol/L — ABNORMAL LOW (ref 135–145)
TCO2: 25 mmol/L (ref 22–32)

## 2022-02-09 LAB — POC URINE PREG, ED: Preg Test, Ur: NEGATIVE

## 2022-02-09 MED ORDER — INSULIN NPH (HUMAN) (ISOPHANE) 100 UNIT/ML ~~LOC~~ SUSP: 10.0000 [IU] | Freq: Two times a day (BID) | SUBCUTANEOUS | 11 refills | Status: AC

## 2022-02-09 MED ORDER — GLIPIZIDE 5 MG PO TABS: 5.0000 mg | ORAL_TABLET | Freq: Every day | ORAL | 0 refills | Status: AC

## 2022-02-09 MED ORDER — METHOCARBAMOL 500 MG PO TABS: 500.0000 mg | ORAL_TABLET | Freq: Two times a day (BID) | ORAL | 0 refills | Status: AC

## 2022-02-09 MED ORDER — METFORMIN HCL 500 MG PO TABS: 500.0000 mg | ORAL_TABLET | Freq: Two times a day (BID) | ORAL | 0 refills | Status: AC

## 2022-02-09 MED ORDER — IBUPROFEN 600 MG PO TABS: 600.0000 mg | ORAL_TABLET | Freq: Four times a day (QID) | ORAL | 0 refills | Status: AC | PRN

## 2022-02-09 NOTE — Discharge Instructions (Signed)
Please follow-up with the [facility].  I've refilled your diabetes medications, but I recommend that you discuss the need for these medications with your doctor prior to restarting them.  I've contacted a Education officer, museum to help  you get in with the community clinic.  Don't start the insulin unless you have a reliable way of monitoring your blood sugar.

## 2022-02-09 NOTE — ED Provider Triage Note (Signed)
Emergency Medicine Provider Triage Evaluation Note  Alison Rowe , a 36 y.o. female  was evaluated in triage.  Pt complains of 2 weeks of generalized lower back pain.  Over the last 2 days its moved to the left lower back.  Hx of DMT2, but is uninsured so tries to maintain this with lifestyle.  Denies fever, chills, N/V/D, constipation.  Tried to manage pain with Tylenol and Aleve, with little relief.  Hx of cholecystectomy.  Mild possible abdominal pain, patient is unsure.  Review of Systems  Positive:  Negative: As above  Physical Exam  BP 119/80 (BP Location: Right Arm)   Pulse (!) 110   Temp 98.8 F (37.1 C) (Oral)   Resp 16   LMP 01/25/2022 (Approximate)   SpO2 93%  Gen:   Awake, no distress   Resp:  Normal effort, CTAB MSK:   Moves extremities without difficulty  Other:  Left lower back/left flank pain.  Very mild generalized abdominal tenderness.  Abdomen soft.  No midline lumbar tenderness.  No appreciated weakness of the lower extremities, they appear neurovascularly intact.  Medical Decision Making  Medically screening exam initiated at 7:22 PM.  Appropriate orders placed.  Alison Rowe was informed that the remainder of the evaluation will be completed by another provider, this initial triage assessment does not replace that evaluation, and the importance of remaining in the ED until their evaluation is complete.  Labs, imaging ordered   Alison Rowe, Cordelia Poche 02/09/22 1934

## 2022-02-09 NOTE — ED Provider Notes (Signed)
[facility] Emergency Department Provider Note MRN:  [id number]  Arrival date & time: 02/09/22     Chief Complaint   Back Pain and Flank Pain   History of Present Illness   Alison Rowe is a 36 y.o. year-old female presents to the ED with chief complaint of left sided low/mid back pain. She states that she has been having the symptoms for the past 2 weeks.  She initially couldn't remember an injury, but then she was able to recall that she started having symptoms after having lifted a king size mattress.  She states that the symptoms are worsened with movement and palpation.  She reports some improvement after exercising on the elliptical.  She has also had some improvement with NSAIDs.  She denies any urinary complaints.  History provided by patient.   Review of Systems  Pertinent review of systems noted in HPI.    Physical Exam   Vitals:   02/09/22 2225 02/09/22 2230  BP: 137/87 127/84  Pulse: 68 (!) 103  Resp: 20   Temp: 98.8 F (37.1 C)   SpO2: 95% 97%    CONSTITUTIONAL:  well-appearing, NAD NEURO:  Alert and oriented x 3, CN 3-12 grossly intact EYES:  eyes equal and reactive ENT/NECK:  Supple, no stridor  CARDIO:  normal rate, regular rhythm, appears well-perfused  PULM:  No respiratory distress, CTAB GI/GU:  non-distended,  MSK/SPINE:  No gross deformities, no edema, moves all extremities, mild left mid/lower back tenderness SKIN:  no rash, atraumatic   *Additional and/or pertinent findings included in MDM below  Diagnostic and Interventional Summary    EKG Interpretation  Date/Time:    Ventricular Rate:    PR Interval:    QRS Duration:   QT Interval:    QTC Calculation:   R Axis:     Text Interpretation:         Labs Reviewed  URINALYSIS, ROUTINE W REFLEX MICROSCOPIC - Abnormal; Notable for the following components:      Result Value   Color, Urine AMBER (*)    APPearance HAZY (*)    Specific Gravity, Urine 1.037 (*)     Glucose, UA >=500 (*)    Hgb urine dipstick SMALL (*)    Ketones, ur 20 (*)    Bacteria, UA MANY (*)    All other components within normal limits  CBC WITH DIFFERENTIAL/PLATELET - Abnormal; Notable for the following components:   MCH 35.1 (*)    MCHC 37.2 (*)    All other components within normal limits  I-STAT CHEM 8, ED - Abnormal; Notable for the following components:   Sodium 133 (*)    Creatinine, Ser 0.30 (*)    Glucose, Bld 241 (*)    Calcium, Ion 1.11 (*)    All other components within normal limits  POC URINE PREG, ED    DG Lumbar Spine 2-3 Views  Final Result      Medications - No data to display   Procedures  /  Critical Care Procedures  ED Course and Medical Decision Making  I have reviewed the triage vital signs, the nursing notes, and pertinent available records from the EMR.  Social Determinants Affecting Complexity of Care: Patient has decreased access to medical care.   ED Course:   Patient here with left sided back pain x 2 weeks.  Top differential diagnoses include muscle strain, disc injury, KS, UTI/pyelo. Medical Decision Making Problems Addressed: Medication refill: chronic illness or injury  Details: Refilled patient DM meds, she hasn't had them for a while, but asks for a refill. I've also contacted TOC to help her establish care with [facility]. Muscle strain: acute illness or injury    Details: Thought 2/2 lifting a king size mattress.  No rash to suggest shingles.  No urinary complaints to suggest pyelo or kidney stone.  Amount and/or Complexity of Data Reviewed Labs: ordered.    Details: Mild hyperglycemia of 241.  Normal creatinine Radiology: ordered and independent interpretation performed.    Details: no fx seen  Risk OTC drugs. Prescription drug management.     Consultants: No consultations were needed in caring for this patient.   Treatment and Plan: Emergency department workup does not suggest an emergent  condition requiring admission or immediate intervention beyond  what has been performed at this time. The patient is safe for discharge and has  been instructed to return immediately for worsening symptoms, change in  symptoms or any other concerns    Final Clinical Impressions(s) / ED Diagnoses     ICD-10-CM   1. Muscle strain  T14.8XXA     2. Medication refill  Z76.0       ED Discharge Orders          Ordered    methocarbamol (ROBAXIN) 500 MG tablet  2 times daily        02/09/22 2335    glipiZIDE (GLUCOTROL) 5 MG tablet  Daily before breakfast        02/09/22 2335    insulin NPH Human (NOVOLIN N RELION) 100 UNIT/ML injection  2 times daily before meals        02/09/22 2335    metFORMIN (GLUCOPHAGE) 500 MG tablet  2 times daily with meals        02/09/22 2335    ibuprofen (ADVIL) 600 MG tablet  Every 6 hours PRN        02/09/22 2335              Discharge Instructions Discussed with and Provided to Patient:     Discharge Instructions      Please [facility]-up with the [facility].  I've refilled your diabetes medications, but I recommend that you discuss the need for these medications with your doctor prior to restarting them.  I've contacted a Education officer, museum to help  you get in with the community clinic.  Don't start the insulin unless you have a reliable way of monitoring your blood sugar.       Montine Circle, PA-C 02/09/22 2342    Regan Lemming, MD 02/10/22 [phone]

## 2022-02-09 NOTE — ED Triage Notes (Signed)
Pt here for low back pain x2 weeks, but states it moved to her L lower back last week. Pt denies urinary symptoms, N/V, fevers/chills. Pt denies any injury to the area. Pt has tried otc pain medication with temporary relief

## 2022-02-10 ENCOUNTER — Telehealth: Payer: Self-pay

## 2022-02-10 NOTE — Telephone Encounter (Signed)
RNCM received TOC consult for PCP needs: [facility], left hippa compliant voicemail message for call back.

## 2022-05-13 ENCOUNTER — Other Ambulatory Visit: Payer: Self-pay

## 2022-05-13 ENCOUNTER — Emergency Department (HOSPITAL_COMMUNITY)
Admission: EM | Admit: 2022-05-13 | Discharge: 2022-05-14 | Disposition: A | Discharge status: Home / Self Care | Payer: Medicaid Other | Attending: Emergency Medicine | Admitting: Emergency Medicine

## 2022-05-13 ENCOUNTER — Emergency Department (HOSPITAL_COMMUNITY): Payer: Medicaid Other

## 2022-05-13 ENCOUNTER — Encounter (HOSPITAL_COMMUNITY): Payer: Self-pay | Admitting: Emergency Medicine

## 2022-05-13 DIAGNOSIS — E119 Type 2 diabetes mellitus without complications: Secondary | ICD-10-CM | POA: Insufficient documentation

## 2022-05-13 DIAGNOSIS — M79641 Pain in right hand: Secondary | ICD-10-CM | POA: Insufficient documentation

## 2022-05-13 DIAGNOSIS — Z7984 Long term (current) use of oral hypoglycemic drugs: Secondary | ICD-10-CM | POA: Diagnosis not present

## 2022-05-13 DIAGNOSIS — M79642 Pain in left hand: Secondary | ICD-10-CM | POA: Insufficient documentation

## 2022-05-13 DIAGNOSIS — Z794 Long term (current) use of insulin: Secondary | ICD-10-CM | POA: Diagnosis not present

## 2022-05-13 LAB — CBG MONITORING, ED: Glucose-Capillary: 212 mg/dL — ABNORMAL HIGH (ref 70–99)

## 2022-05-13 NOTE — ED Provider Triage Note (Signed)
Emergency Medicine Provider Triage Evaluation Note  Alison Rowe , a 36 y.o. female  was evaluated in triage.  Pt complains of bilateral hand pain for several months. Used shovel one day and has hurt ever since. Hx of diabetes, doesn't have a glucometer to check her blood sugar. Intermittent shooting, stabbing and burning pain in bilateral hands and left wrist  Review of Systems  Positive: As above Negative: Fever, chills, wounds  Physical Exam  BP (!) 133/93 (BP Location: Right Arm)   Pulse (!) 108   Temp 99.4 F (37.4 C) (Oral)   Resp 18   SpO2 95%  Gen:   Awake, no distress   Resp:  Normal effort  MSK:   Moves extremities without difficulty  Other:    Medical Decision Making  Medically screening exam initiated at 9:09 PM.  Appropriate orders placed.  Concetta Guion was informed that the remainder of the evaluation will be completed by another provider, this initial triage assessment does not replace that evaluation, and the importance of remaining in the ED until their evaluation is complete.     Su Monks, Cordelia Poche 05/13/22 2110

## 2022-05-13 NOTE — ED Triage Notes (Signed)
Patient reports bilateral hands pain since June this year , denies injury mor fall , pain worsened today /pain radiating to left wrist and forearm .

## 2022-05-14 MED ORDER — MELOXICAM 7.5 MG PO TABS: 15.0000 mg | ORAL_TABLET | Freq: Every day | ORAL | 0 refills | Status: AC

## 2022-05-14 MED ORDER — HYDROCODONE-ACETAMINOPHEN 5-325 MG PO TABS
1.0000 | ORAL_TABLET | Freq: Once | ORAL | Status: AC
  Administered 2022-05-14: 1 via ORAL
  Filled 2022-05-14: qty 1

## 2022-05-14 NOTE — ED Notes (Signed)
Patient verbalizes understanding of discharge instructions. Opportunity for questioning and answers were provided. Armband removed by staff, pt discharged from ED.  

## 2022-05-14 NOTE — ED Provider Notes (Signed)
[facility] EMERGENCY DEPARTMENT Provider Note   CSN: [id number] Arrival date & time: 05/13/22  2040     History  Chief Complaint  Patient presents with   Hands Pain     Alison Rowe is a 36 y.o. female.  The history is provided by the patient and medical records.   36 y.o. F with hx of DM2, HLP, presenting to the ED with bilateral hand pain since June 2023.  She denies any specific injury or trauma to the hands.  States it started after doing yard work with shovel, however even after several weeks pain persists.  Pain worse in the left lateral hand but now worsening in right hand as well.  Pain is worse with movement, better if exposed to hot water while taking shower, etc.  She states she has been taking motrin with some relief.  She is right hand dominant.  Hx of DM, unsure how sugars have been running as she does not have glucometer at home.  She is a stay at home mom, does a lot of house work (cooking, cleaning, dishes, mopping, sweeping, etc).  Home Medications Prior to Admission medications   Medication Sig Start Date End Date Taking? Authorizing Provider  meloxicam (MOBIC) 7.5 MG tablet Take 2 tablets (15 mg total) by mouth daily. 05/14/22  Yes Garlon Hatchet, PA-C  acetaminophen (TYLENOL) 500 MG tablet Take 1,000 mg by mouth every 6 (six) hours as needed for mild pain or headache.    [provider]  glipiZIDE (GLUCOTROL) 5 MG tablet Take 1 tablet (5 mg total) by mouth daily before breakfast. 02/09/22   Roxy Horseman, PA-C  ibuprofen (ADVIL) 600 MG tablet Take 1 tablet (600 mg total) by mouth every 6 (six) hours as needed. 02/09/22   Roxy Horseman, PA-C  insulin NPH Human (NOVOLIN N RELION) 100 UNIT/ML injection Inject 0.1 mLs (10 Units total) into the skin 2 (two) times daily before a meal. 02/09/22   Roxy Horseman, PA-C  metFORMIN (GLUCOPHAGE) 500 MG tablet Take 1 tablet (500 mg total) by mouth 2 (two) times daily with a meal. 02/09/22    Roxy Horseman, PA-C  methocarbamol (ROBAXIN) 500 MG tablet Take 1 tablet (500 mg total) by mouth 2 (two) times daily. 02/09/22   Roxy Horseman, PA-C  norgestimate-ethinyl estradiol (ORTHO-CYCLEN) 0.25-35 MG-MCG tablet Take 1 tablet by mouth daily. Patient not taking: Reported on 02/09/2022 02/06/22   Brock Bad, MD  norgestimate-ethinyl estradiol (ORTHO-CYCLEN) 0.25-35 MG-MCG tablet Take 1 tablet by mouth daily.    [provider]      Allergies    Patient has no known allergies.    Review of Systems   Review of Systems  Musculoskeletal:  Positive for arthralgias.  All other systems reviewed and are negative.   Physical Exam Updated Vital Signs BP 104/61 (BP Location: Left Arm)   Pulse (!) 102   Temp 98.2 F (36.8 C) (Oral)   Resp 18   SpO2 98%   Physical Exam Vitals and nursing note reviewed.  Constitutional:      Appearance: She is well-developed.  HENT:     Head: Normocephalic and atraumatic.  Eyes:     Conjunctiva/sclera: Conjunctivae normal.     Pupils: Pupils are equal, round, and reactive to light.  Cardiovascular:     Rate and Rhythm: Normal rate and regular rhythm.     Heart sounds: Normal heart sounds.  Pulmonary:     Effort: Pulmonary effort is normal.  Breath sounds: Normal breath sounds.  Abdominal:     General: Bowel sounds are normal.     Palpations: Abdomen is soft.  Musculoskeletal:        General: Normal range of motion.     Cervical back: Normal range of motion.     Comments: Bilateral hands and wrists normal in appearance without swelling, erythema, or induration, no warmth to touch, no pain elicited with ROM on my exam, radial pulses intact, normal grips  Skin:    General: Skin is warm and dry.  Neurological:     Mental Status: She is alert and oriented to person, place, and time.     ED Results / Procedures / Treatments   Labs (all labs ordered are listed, but only abnormal results are displayed) Labs Reviewed  CBG  MONITORING, ED - Abnormal; Notable for the following components:      Result Value   Glucose-Capillary 212 (*)    All other components within normal limits    EKG None  Radiology DG Hand Complete Left  Result Date: 05/13/2022 CLINICAL DATA:  Bilateral hand pain. EXAM: LEFT HAND - COMPLETE 3+ VIEW COMPARISON:  None Available. FINDINGS: There is no evidence of fracture or dislocation. There is no evidence of arthropathy or other focal bone abnormality. Soft tissues are unremarkable. IMPRESSION: Negative. Electronically Signed   By: Aram Candela M.D.   On: 05/13/2022 21:38   DG Hand Complete Right  Result Date: 05/13/2022 CLINICAL DATA:  Bilateral hand pain. EXAM: RIGHT HAND - COMPLETE 3+ VIEW COMPARISON:  None Available. FINDINGS: There is no evidence of fracture or dislocation. There is no evidence of arthropathy or other focal bone abnormality. Soft tissues are unremarkable. IMPRESSION: Negative. Electronically Signed   By: Aram Candela M.D.   On: 05/13/2022 21:37    Procedures Procedures    Medications Ordered in ED Medications  HYDROcodone-acetaminophen (NORCO/VICODIN) 5-325 MG per tablet 1 tablet (1 tablet Oral Given 05/14/22 0147)    ED Course/ Medical Decision Making/ A&P                           Medical Decision Making Risk Prescription drug management.   36 year old female here with bilateral hand pain for the past several months, slightly worse than right.  This began after doing yard work with shovel, however even after several weeks finger stiffness.  She is afebrile, nontoxic.  Bilateral hands and wrist are normal in appearance on exam without swelling, erythema, induration, warmth to touch.  She has no elicited pain with range of motion on my exam.  Both hands are neurovascularly intact.  She is diabetic, CBG 212.  X-rays of both hands negative for any acute findings.  Symptoms may be due to neuropathy versus tendinitis or similar.  She does report doing a lot  of repetitive stuff at home (cooking, cleaning, etc).  No signs/symptoms concerning for septic joints.  Will start on course of meloxicam (last SrCr WNL) and have her follow-up as an OP.  States she did make appt with new clinic for next week, will also give information for local wellness clinic.  Can return here for new concerns.  Final Clinical Impression(s) / ED Diagnoses Final diagnoses:  Bilateral hand pain    Rx / DC Orders ED Discharge Orders          Ordered    meloxicam (MOBIC) 7.5 MG tablet  Daily        05/14/22  0116              Garlon Hatchet, PA-C 05/14/22 [id number]    Marily Memos, MD 05/14/22 0430

## 2022-05-14 NOTE — Discharge Instructions (Signed)
Take the prescribed medication as directed. Follow-up with clinic next week as scheduled, or you may call the wellness clinic and establish care there.  Contact information listed for you. Return to the ED for new or worsening symptoms.
